# Patient Record
Sex: Male | Born: 1935 | Race: White | Hispanic: No | Marital: Married | Smoking: Former smoker
Health system: Southern US, Community
[De-identification: ages and names within clinical notes are randomized; demographics above are authoritative.]

## PROBLEM LIST (undated history)

## (undated) DIAGNOSIS — E119 Type 2 diabetes mellitus without complications: Secondary | ICD-10-CM

## (undated) DIAGNOSIS — I2510 Atherosclerotic heart disease of native coronary artery without angina pectoris: Secondary | ICD-10-CM

## (undated) DIAGNOSIS — I10 Essential (primary) hypertension: Secondary | ICD-10-CM

## (undated) DIAGNOSIS — N289 Disorder of kidney and ureter, unspecified: Secondary | ICD-10-CM

## (undated) HISTORY — PX: APPENDECTOMY: SHX54

## (undated) HISTORY — PX: BACK SURGERY: SHX140

---

## 2015-09-11 ENCOUNTER — Encounter (HOSPITAL_COMMUNITY): Payer: Self-pay | Admitting: Emergency Medicine

## 2015-09-11 ENCOUNTER — Emergency Department (HOSPITAL_COMMUNITY): Payer: Medicare Other

## 2015-09-11 ENCOUNTER — Emergency Department (HOSPITAL_COMMUNITY)
Admission: EM | Admit: 2015-09-11 | Discharge: 2015-09-11 | Disposition: A | Discharge status: Home / Self Care | Payer: Medicare Other | Attending: Emergency Medicine | Admitting: Emergency Medicine

## 2015-09-11 ENCOUNTER — Other Ambulatory Visit: Payer: Self-pay

## 2015-09-11 DIAGNOSIS — Z992 Dependence on renal dialysis: Secondary | ICD-10-CM | POA: Insufficient documentation

## 2015-09-11 DIAGNOSIS — I1311 Hypertensive heart and chronic kidney disease without heart failure, with stage 5 chronic kidney disease, or end stage renal disease: Secondary | ICD-10-CM | POA: Diagnosis not present

## 2015-09-11 DIAGNOSIS — E1122 Type 2 diabetes mellitus with diabetic chronic kidney disease: Secondary | ICD-10-CM | POA: Diagnosis not present

## 2015-09-11 DIAGNOSIS — R1013 Epigastric pain: Secondary | ICD-10-CM | POA: Diagnosis present

## 2015-09-11 DIAGNOSIS — N186 End stage renal disease: Secondary | ICD-10-CM | POA: Diagnosis not present

## 2015-09-11 DIAGNOSIS — Z87891 Personal history of nicotine dependence: Secondary | ICD-10-CM | POA: Insufficient documentation

## 2015-09-11 DIAGNOSIS — I2510 Atherosclerotic heart disease of native coronary artery without angina pectoris: Secondary | ICD-10-CM | POA: Insufficient documentation

## 2015-09-11 HISTORY — DX: Type 2 diabetes mellitus without complications: E11.9

## 2015-09-11 HISTORY — DX: Disorder of kidney and ureter, unspecified: N28.9

## 2015-09-11 HISTORY — DX: Atherosclerotic heart disease of native coronary artery without angina pectoris: I25.10

## 2015-09-11 HISTORY — DX: Essential (primary) hypertension: I10

## 2015-09-11 LAB — CBC
HEMATOCRIT: 34.9 % — AB (ref 39.0–52.0)
HEMOGLOBIN: 11 g/dL — AB (ref 13.0–17.0)
MCH: 31.1 pg (ref 26.0–34.0)
MCHC: 31.5 g/dL (ref 30.0–36.0)
MCV: 98.6 fL (ref 78.0–100.0)
Platelets: UNDETERMINED 10*3/uL (ref 150–400)
RBC: 3.54 MIL/uL — AB (ref 4.22–5.81)
RDW: 15.9 % — ABNORMAL HIGH (ref 11.5–15.5)
WBC: 7.7 10*3/uL (ref 4.0–10.5)

## 2015-09-11 LAB — BASIC METABOLIC PANEL
ANION GAP: 11 (ref 5–15)
BUN: 28 mg/dL — ABNORMAL HIGH (ref 6–20)
CHLORIDE: 100 mmol/L — AB (ref 101–111)
CO2: 24 mmol/L (ref 22–32)
Calcium: 9.2 mg/dL (ref 8.9–10.3)
Creatinine, Ser: 6.86 mg/dL — ABNORMAL HIGH (ref 0.61–1.24)
GFR calc non Af Amer: 7 mL/min — ABNORMAL LOW (ref >60)
GFR, EST AFRICAN AMERICAN: 8 mL/min — AB (ref >60)
GLUCOSE: 279 mg/dL — AB (ref 65–99)
Potassium: 5.4 mmol/L — ABNORMAL HIGH (ref 3.5–5.1)
Sodium: 135 mmol/L (ref 135–145)

## 2015-09-11 LAB — LIPASE, BLOOD: LIPASE: 43 U/L (ref 11–51)

## 2015-09-11 LAB — I-STAT TROPONIN, ED: TROPONIN I, POC: 0.14 ng/mL — AB (ref 0.00–0.08)

## 2015-09-11 MED ORDER — OXYCODONE-ACETAMINOPHEN 5-325 MG PO TABS
1.0000 | ORAL_TABLET | Freq: Once | ORAL | Status: AC
  Administered 2015-09-11: 1 via ORAL
  Filled 2015-09-11: qty 1

## 2015-09-11 MED ORDER — OXYCODONE-ACETAMINOPHEN 5-325 MG PO TABS: 1.0000 | ORAL_TABLET | Freq: Four times a day (QID) | ORAL | Status: DC | PRN

## 2015-09-11 NOTE — ED Notes (Signed)
Pt arrives via POv from home with chest pains and abdominal pains since last night. States recently admitted and discharged from [facility]. Pt is M/W/F dialysis pt.

## 2015-09-11 NOTE — Discharge Instructions (Signed)
°  Call [facility] to arrange a follow-up appointment. The contact information has been provided in this discharge summary for you to call and make these arrangements.  Return to the emergency department if your symptoms significantly worsen or change.   Abdominal Pain, Adult Many things can cause abdominal pain. Usually, abdominal pain is not caused by a disease and will improve without treatment. It can often be observed and treated at home. Your health care provider will do a physical exam and possibly order blood tests and X-rays to help determine the seriousness of your pain. However, in many cases, more time must pass before a clear cause of the pain can be found. Before that point, your health care provider may not know if you need more testing or further treatment. HOME CARE INSTRUCTIONS Monitor your abdominal pain for any changes. The following actions may help to alleviate any discomfort you are experiencing:  Only take over-the-counter or prescription medicines as directed by your health care provider.  Do not take laxatives unless directed to do so by your health care provider.  Try a clear liquid diet (broth, tea, or water) as directed by your health care provider. Slowly move to a bland diet as tolerated. SEEK MEDICAL CARE IF:  You have unexplained abdominal pain.  You have abdominal pain associated with nausea or diarrhea.  You have pain when you urinate or have a bowel movement.  You experience abdominal pain that wakes you in the night.  You have abdominal pain that is worsened or improved by eating food.  You have abdominal pain that is worsened with eating fatty foods.  You have a fever. SEEK IMMEDIATE MEDICAL CARE IF:  Your pain does not go away within 2 hours.  You keep throwing up (vomiting).  Your pain is felt only in portions of the abdomen, such as the right side or the left lower portion of the abdomen.  You pass bloody or black tarry  stools. MAKE SURE YOU:  Understand these instructions.  Will watch your condition.  Will get help right away if you are not doing well or get worse.   This information is not intended to replace advice given to you by your health care provider. Make sure you discuss any questions you have with your health care provider.   Document Released: 12/31/2004 Document Revised: 12/12/2014 Document Reviewed: 11/30/2012 Elsevier Interactive Patient Education [employer]2016 Elsevier Inc.

## 2015-09-11 NOTE — ED Provider Notes (Signed)
CSN: [id number]     Arrival date & time 09/11/15  1131 History   First MD Initiated Contact with Patient 09/11/15 1220     Chief Complaint  Patient presents with  . Chest Pain  . Abdominal Pain     (Consider location/radiation/quality/duration/timing/severity/associated sxs/prior Treatment) HPI Comments: Patient is a 80 year old male with past medical history of end-stage renal disease on hemodialysis, diabetes, and hypertension. He presents for evaluation of epigastric pain which is been worsening over the past several months. He reports the pain is constant, however does worsen at times. It radiates to the bilateral lower rib cage. He denies any fevers or chills. He denies any vomiting or diarrhea.  He was worked up at [facility] where he tells me he underwent a heart catheterization which revealed no significant stenoses, CT scan, and multiple other tests, however no cause was found.  Patient is a 80 y.o. male presenting with chest pain and abdominal pain. The history is provided by the patient.  Chest Pain Pain location:  Epigastric Pain quality: stabbing   Radiates to: Lower ribs on both sides. Pain radiates to the back: no   Pain severity:  Moderate Onset quality:  Gradual Duration:  3 months Timing:  Constant Progression:  Worsening Chronicity:  New Associated symptoms: abdominal pain   Abdominal Pain Associated symptoms: chest pain     Past Medical History  Diagnosis Date  . Coronary artery disease   . Renal disorder   . Hypertension   . Diabetes mellitus without complication [facility])    Past Surgical History  Procedure Laterality Date  . Appendectomy    . Back surgery     No family history on file. Social History  Substance Use Topics  . Smoking status: Former Games developer  . Smokeless tobacco: Not on file  . Alcohol Use: No    Review of Systems  Cardiovascular: Positive for chest pain.  Gastrointestinal: Positive for abdominal pain.  All other systems  reviewed and are negative.     Allergies  Lisinopril  Home Medications   Prior to Admission medications   Not on File   BP 165/53 mmHg  Pulse 66  Temp(Src) 98.1 F (36.7 C) (Oral)  Resp 18  Wt 186 lb 1.6 oz (84.414 kg)  SpO2 91% Physical Exam  Constitutional: He is oriented to person, place, and time. He appears well-developed and well-nourished. No distress.  HENT:  Head: Normocephalic and atraumatic.  Mouth/Throat: Oropharynx is clear and moist.  Neck: Normal range of motion. Neck supple.  Cardiovascular: Normal rate and regular rhythm.  Exam reveals no friction rub.   No murmur heard. Pulmonary/Chest: Effort normal and breath sounds normal. No respiratory distress. He has no wheezes. He has no rales.  Abdominal: Soft. Bowel sounds are normal. He exhibits no distension. There is no tenderness.  Musculoskeletal: Normal range of motion. He exhibits no edema.  Neurological: He is alert and oriented to person, place, and time. Coordination normal.  Skin: Skin is warm and dry. He is not diaphoretic.  Nursing note and vitals reviewed.   ED Course  Procedures (including critical care time) Labs Review Labs Reviewed  BASIC METABOLIC PANEL - Abnormal; Notable for the following:    Potassium 5.4 (*)    Chloride 100 (*)    Glucose, Bld 279 (*)    BUN 28 (*)    Creatinine, Ser 6.86 (*)    GFR calc non Af Amer 7 (*)    GFR calc Af Amer 8 (*)  All other components within normal limits  CBC - Abnormal; Notable for the following:    RBC 3.54 (*)    Hemoglobin 11.0 (*)    HCT 34.9 (*)    RDW 15.9 (*)    All other components within normal limits  I-STAT TROPOININ, ED - Abnormal; Notable for the following:    Troponin i, poc 0.14 (*)    All other components within normal limits  LIPASE, BLOOD    Imaging Review No results found. I have personally reviewed and evaluated these images and lab results as part of my medical decision-making.   EKG  Interpretation   Date/Time:  Wednesday September 11 2015 11:45:53 EDT Ventricular Rate:  57 PR Interval:  170 QRS Duration: 78 QT Interval:  468 QTC Calculation: 455 R Axis:   55 Text Interpretation:  Sinus bradycardia Anteroseptal infarct , age  undetermined ST \\T \ T wave abnormality, consider inferior ischemia  Abnormal ECG Confirmed by Oluwatosin Bracy  MD, Trevone Prestwood ([id number]) on 09/12/2015 9:14:57 AM      MDM   Final diagnoses:  None    Patient is a 80 year old male with past medical history of coronary artery disease. He presents with complaints of epigastric discomfort which has been going on for the past 2 months. Admitted at an outside facility, however no cause has been found. He presents here today hoping for an answer to why this discomfort is present. At the outside facility, he has undergone heart cath, ultrasound, CAT scan, laboratory studies. His CT scan was repeated today and was unremarkable. His laboratory studies do not reveal evidence for a cardiac etiology, pancreatitis, or other acute pathology. Patient will be given a referral to gastroenterology as an outpatient to discuss an endoscopy. He will also be provided with pain medication he can take for his discomfort. Reasons for return have been provided and the patient understands to return if he worsens.    Geoffery Lyonsouglas Oluwatimilehin Balfour, MD 09/12/15 [phone]

## 2015-12-11 ENCOUNTER — Emergency Department (HOSPITAL_COMMUNITY): Payer: Medicare Other

## 2015-12-11 ENCOUNTER — Encounter (HOSPITAL_COMMUNITY): Payer: Self-pay | Admitting: Emergency Medicine

## 2015-12-11 ENCOUNTER — Inpatient Hospital Stay (HOSPITAL_COMMUNITY)
Admission: EM | Admit: 2015-12-11 | Discharge: 2015-12-14 | DRG: 193 | Disposition: A | Discharge status: Home / Self Care | Payer: Medicare Other | Attending: Student in an Organized Health Care Education/Training Program | Admitting: Student in an Organized Health Care Education/Training Program

## 2015-12-11 DIAGNOSIS — Z794 Long term (current) use of insulin: Secondary | ICD-10-CM

## 2015-12-11 DIAGNOSIS — E8889 Other specified metabolic disorders: Secondary | ICD-10-CM | POA: Diagnosis present

## 2015-12-11 DIAGNOSIS — Z992 Dependence on renal dialysis: Secondary | ICD-10-CM | POA: Diagnosis not present

## 2015-12-11 DIAGNOSIS — J189 Pneumonia, unspecified organism: Principal | ICD-10-CM | POA: Diagnosis present

## 2015-12-11 DIAGNOSIS — I2510 Atherosclerotic heart disease of native coronary artery without angina pectoris: Secondary | ICD-10-CM | POA: Diagnosis present

## 2015-12-11 DIAGNOSIS — N186 End stage renal disease: Secondary | ICD-10-CM | POA: Diagnosis present

## 2015-12-11 DIAGNOSIS — E43 Unspecified severe protein-calorie malnutrition: Secondary | ICD-10-CM | POA: Diagnosis present

## 2015-12-11 DIAGNOSIS — N2581 Secondary hyperparathyroidism of renal origin: Secondary | ICD-10-CM | POA: Diagnosis present

## 2015-12-11 DIAGNOSIS — Z7902 Long term (current) use of antithrombotics/antiplatelets: Secondary | ICD-10-CM

## 2015-12-11 DIAGNOSIS — Z79899 Other long term (current) drug therapy: Secondary | ICD-10-CM

## 2015-12-11 DIAGNOSIS — E8770 Fluid overload, unspecified: Secondary | ICD-10-CM | POA: Diagnosis present

## 2015-12-11 DIAGNOSIS — D649 Anemia, unspecified: Secondary | ICD-10-CM | POA: Diagnosis present

## 2015-12-11 DIAGNOSIS — R05 Cough: Secondary | ICD-10-CM | POA: Diagnosis present

## 2015-12-11 DIAGNOSIS — Z7982 Long term (current) use of aspirin: Secondary | ICD-10-CM

## 2015-12-11 DIAGNOSIS — E1122 Type 2 diabetes mellitus with diabetic chronic kidney disease: Secondary | ICD-10-CM | POA: Diagnosis present

## 2015-12-11 DIAGNOSIS — R197 Diarrhea, unspecified: Secondary | ICD-10-CM | POA: Diagnosis present

## 2015-12-11 DIAGNOSIS — M7989 Other specified soft tissue disorders: Secondary | ICD-10-CM | POA: Diagnosis not present

## 2015-12-11 DIAGNOSIS — Z87891 Personal history of nicotine dependence: Secondary | ICD-10-CM | POA: Diagnosis not present

## 2015-12-11 DIAGNOSIS — I120 Hypertensive chronic kidney disease with stage 5 chronic kidney disease or end stage renal disease: Secondary | ICD-10-CM | POA: Diagnosis present

## 2015-12-11 LAB — CBC WITH DIFFERENTIAL/PLATELET
BASOS ABS: 0 10*3/uL (ref 0.0–0.1)
BASOS ABS: 0 10*3/uL (ref 0.0–0.1)
BASOS PCT: 0 %
Basophils Relative: 0 %
Eosinophils Absolute: 0 10*3/uL (ref 0.0–0.7)
Eosinophils Absolute: 0 10*3/uL (ref 0.0–0.7)
Eosinophils Relative: 0 %
Eosinophils Relative: 0 %
HEMATOCRIT: 11.6 % — AB (ref 39.0–52.0)
HEMATOCRIT: 27.5 % — AB (ref 39.0–52.0)
HEMOGLOBIN: 8.5 g/dL — AB (ref 13.0–17.0)
Hemoglobin: 3.6 g/dL — CL (ref 13.0–17.0)
LYMPHS ABS: 0.7 10*3/uL (ref 0.7–4.0)
LYMPHS PCT: 14 %
LYMPHS PCT: 12 %
Lymphs Abs: 1.3 10*3/uL (ref 0.7–4.0)
MCH: 31 pg (ref 26.0–34.0)
MCH: 31.1 pg (ref 26.0–34.0)
MCHC: 31 g/dL (ref 30.0–36.0)
MCHC: 30.9 g/dL (ref 30.0–36.0)
MCV: 100 fL (ref 78.0–100.0)
MCV: 100.7 fL — AB (ref 78.0–100.0)
MONOS PCT: 9 %
Monocytes Absolute: 0.4 10*3/uL (ref 0.1–1.0)
Monocytes Absolute: 0.9 10*3/uL (ref 0.1–1.0)
Monocytes Relative: 8 %
NEUTROS ABS: 3.8 10*3/uL (ref 1.7–7.7)
NEUTROS ABS: 8.2 10*3/uL — AB (ref 1.7–7.7)
NEUTROS PCT: 79 %
Neutrophils Relative %: 79 %
Platelets: 161 10*3/uL (ref 150–400)
RBC: 1.16 MIL/uL — AB (ref 4.22–5.81)
RBC: 2.73 MIL/uL — ABNORMAL LOW (ref 4.22–5.81)
RDW: 19.6 % — ABNORMAL HIGH (ref 11.5–15.5)
RDW: 19.1 % — ABNORMAL HIGH (ref 11.5–15.5)
WBC: 4.8 10*3/uL (ref 4.0–10.5)
WBC: 10.4 10*3/uL (ref 4.0–10.5)

## 2015-12-11 LAB — COMPREHENSIVE METABOLIC PANEL
ALBUMIN: 2.3 g/dL — AB (ref 3.5–5.0)
ALK PHOS: 59 U/L (ref 38–126)
ALT: 15 U/L — ABNORMAL LOW (ref 17–63)
ANION GAP: 13 (ref 5–15)
AST: 21 U/L (ref 15–41)
BUN: 60 mg/dL — ABNORMAL HIGH (ref 6–20)
CALCIUM: 7.5 mg/dL — AB (ref 8.9–10.3)
CO2: 21 mmol/L — AB (ref 22–32)
Chloride: 104 mmol/L (ref 101–111)
Creatinine, Ser: 6.73 mg/dL — ABNORMAL HIGH (ref 0.61–1.24)
GFR calc non Af Amer: 7 mL/min — ABNORMAL LOW (ref >60)
GFR, EST AFRICAN AMERICAN: 8 mL/min — AB (ref >60)
Glucose, Bld: 52 mg/dL — ABNORMAL LOW (ref 65–99)
POTASSIUM: 4.7 mmol/L (ref 3.5–5.1)
SODIUM: 138 mmol/L (ref 135–145)
Total Bilirubin: 0.4 mg/dL (ref 0.3–1.2)
Total Protein: 5.2 g/dL — ABNORMAL LOW (ref 6.5–8.1)

## 2015-12-11 LAB — PROTIME-INR
INR: 1.22
Prothrombin Time: 15.5 seconds — ABNORMAL HIGH (ref 11.4–15.2)

## 2015-12-11 LAB — TYPE AND SCREEN
ABO/RH(D): O POS
ANTIBODY SCREEN: NEGATIVE

## 2015-12-11 LAB — ABO/RH: ABO/RH(D): O POS

## 2015-12-11 LAB — I-STAT TROPONIN, ED: Troponin i, poc: 0.17 ng/mL (ref 0.00–0.08)

## 2015-12-11 LAB — I-STAT CG4 LACTIC ACID, ED
LACTIC ACID, VENOUS: 0.33 mmol/L — AB (ref 0.5–1.9)
Lactic Acid, Venous: 0.5 mmol/L (ref 0.5–1.9)
Lactic Acid, Venous: 1.07 mmol/L (ref 0.5–1.9)

## 2015-12-11 LAB — GLUCOSE, CAPILLARY: Glucose-Capillary: 224 mg/dL — ABNORMAL HIGH (ref 65–99)

## 2015-12-11 MED ORDER — ASPIRIN EC 81 MG PO TBEC
81.0000 mg | DELAYED_RELEASE_TABLET | Freq: Every day | ORAL | Status: DC
  Administered 2015-12-11 – 2015-12-14 (×4): 81 mg via ORAL
  Filled 2015-12-11 (×4): qty 1

## 2015-12-11 MED ORDER — VANCOMYCIN HCL 10 G IV SOLR
2000.0000 mg | Freq: Once | INTRAVENOUS | Status: AC
  Administered 2015-12-11: 2000 mg via INTRAVENOUS
  Filled 2015-12-11: qty 2000

## 2015-12-11 MED ORDER — DEXTROSE 5 % IV SOLN
2.0000 g | INTRAVENOUS | Status: AC
  Administered 2015-12-11 – 2015-12-13 (×2): 2 g via INTRAVENOUS
  Filled 2015-12-11 (×2): qty 2

## 2015-12-11 MED ORDER — SEVELAMER CARBONATE 800 MG PO TABS
800.0000 mg | ORAL_TABLET | Freq: Every day | ORAL | Status: DC
  Administered 2015-12-12 – 2015-12-13 (×2): 800 mg via ORAL
  Filled 2015-12-11 (×3): qty 1

## 2015-12-11 MED ORDER — INSULIN ASPART 100 UNIT/ML ~~LOC~~ SOLN
0.0000 [IU] | Freq: Every day | SUBCUTANEOUS | Status: DC
  Administered 2015-12-13: 2 [IU] via SUBCUTANEOUS

## 2015-12-11 MED ORDER — SIMVASTATIN 20 MG PO TABS
20.0000 mg | ORAL_TABLET | Freq: Every day | ORAL | Status: DC
  Administered 2015-12-11 – 2015-12-13 (×3): 20 mg via ORAL
  Filled 2015-12-11 (×3): qty 1

## 2015-12-11 MED ORDER — RENA-VITE PO TABS
1.0000 | ORAL_TABLET | Freq: Every day | ORAL | Status: DC
Start: 2015-12-11 — End: 2015-12-14
  Administered 2015-12-11 – 2015-12-13 (×3): 1 via ORAL
  Filled 2015-12-11 (×3): qty 1

## 2015-12-11 MED ORDER — PIPERACILLIN-TAZOBACTAM 3.375 G IVPB
3.3750 g | Freq: Two times a day (BID) | INTRAVENOUS | Status: DC
  Filled 2015-12-11: qty 50

## 2015-12-11 MED ORDER — ACETAMINOPHEN 325 MG PO TABS
650.0000 mg | ORAL_TABLET | Freq: Four times a day (QID) | ORAL | Status: DC | PRN
  Administered 2015-12-11: 650 mg via ORAL
  Filled 2015-12-11: qty 2

## 2015-12-11 MED ORDER — INSULIN ASPART 100 UNIT/ML ~~LOC~~ SOLN
0.0000 [IU] | Freq: Three times a day (TID) | SUBCUTANEOUS | Status: DC
  Administered 2015-12-12: 2 [IU] via SUBCUTANEOUS
  Administered 2015-12-12: 3 [IU] via SUBCUTANEOUS
  Administered 2015-12-13: 5 [IU] via SUBCUTANEOUS
  Administered 2015-12-13: 9 [IU] via SUBCUTANEOUS
  Administered 2015-12-13: 7 [IU] via SUBCUTANEOUS

## 2015-12-11 MED ORDER — CLOPIDOGREL BISULFATE 75 MG PO TABS
75.0000 mg | ORAL_TABLET | Freq: Every day | ORAL | Status: DC
  Administered 2015-12-11 – 2015-12-14 (×4): 75 mg via ORAL
  Filled 2015-12-11 (×4): qty 1

## 2015-12-11 MED ORDER — GABAPENTIN 100 MG PO CAPS: 100.0000 mg | ORAL_CAPSULE | ORAL | Status: DC

## 2015-12-11 MED ORDER — CARVEDILOL 25 MG PO TABS
25.0000 mg | ORAL_TABLET | Freq: Two times a day (BID) | ORAL | Status: DC
  Administered 2015-12-12 – 2015-12-13 (×3): 25 mg via ORAL
  Filled 2015-12-11 (×3): qty 1

## 2015-12-11 MED ORDER — SEVELAMER CARBONATE 800 MG PO TABS
1600.0000 mg | ORAL_TABLET | Freq: Two times a day (BID) | ORAL | Status: DC
  Administered 2015-12-12 – 2015-12-14 (×5): 1600 mg via ORAL
  Filled 2015-12-11 (×4): qty 2

## 2015-12-11 MED ORDER — CINACALCET HCL 30 MG PO TABS
30.0000 mg | ORAL_TABLET | Freq: Every day | ORAL | Status: DC
  Administered 2015-12-11 – 2015-12-13 (×3): 30 mg via ORAL
  Filled 2015-12-11 (×4): qty 1

## 2015-12-11 MED ORDER — VANCOMYCIN HCL IN DEXTROSE 1-5 GM/200ML-% IV SOLN: 1000.0000 mg | INTRAVENOUS | Status: DC

## 2015-12-11 MED ORDER — HEPARIN SODIUM (PORCINE) 5000 UNIT/ML IJ SOLN
5000.0000 [IU] | Freq: Three times a day (TID) | INTRAMUSCULAR | Status: DC
  Administered 2015-12-11 – 2015-12-14 (×8): 5000 [IU] via SUBCUTANEOUS
  Filled 2015-12-11 (×8): qty 1

## 2015-12-11 MED ORDER — ACETAMINOPHEN 325 MG PO TABS
650.0000 mg | ORAL_TABLET | Freq: Once | ORAL | Status: AC
  Administered 2015-12-11: 650 mg via ORAL
  Filled 2015-12-11: qty 2

## 2015-12-11 MED ORDER — PIPERACILLIN-TAZOBACTAM 3.375 G IVPB 30 MIN
3.3750 g | Freq: Once | INTRAVENOUS | Status: AC
  Administered 2015-12-11: 3.375 g via INTRAVENOUS
  Filled 2015-12-11: qty 50

## 2015-12-11 MED ORDER — PANTOPRAZOLE SODIUM 40 MG PO TBEC
40.0000 mg | DELAYED_RELEASE_TABLET | Freq: Every day | ORAL | Status: DC
  Administered 2015-12-11 – 2015-12-14 (×4): 40 mg via ORAL
  Filled 2015-12-11 (×4): qty 1

## 2015-12-11 NOTE — Progress Notes (Signed)
Pt arrived via 5w17 by strecher. Pt moved over to bed by nursing staff. Pt A&O x4. ROM x4. Bilateral le edema. Blister on right buttock open no drainage. Pt states "[facility]. V/S taken see chart. Fistula right forearm positive for thrill and bruie. Graft in left forearm not working. Will continue to monitor. Francee Piccoloerek RCharity fundraiser

## 2015-12-11 NOTE — Progress Notes (Signed)
Pharmacy Antibiotic Note  Allen NorrisWilliam Raz is a 80 y.o. male admitted on 12/11/2015 with pneumonia.  Pharmacy has been consulted for vancomycin and zosyn dosing. Tmax is 102.7 and WBC is WNL. Pt is on HD with last session on Monday. He is s/p [facility].   Plan: - Vancomycin 2gm IV x 1 then 1gm post-HD  - Zosyn 3.375gm IV Q12H (4 hr inf) - F/u renal plans, C&S, clinical status and pre-HD level when appropriate     Temp (24hrs), Avg:102.3 F (39.1 C), Min:101.8 F (38.8 C), Max:102.7 F (39.3 C)   Recent Labs Lab 12/11/15 1345 12/11/15 1359 12/11/15 1431 12/11/15 1500 12/11/15 1508  WBC 4.8  --   --  10.4  --   CREATININE  --   --   --  6.73*  --   LATICACIDVEN  --  0.33* 0.50  --  1.07    CrCl cannot be calculated (Unknown ideal weight.).    Allergies  Allergen Reactions  . Lisinopril Cough    Antimicrobials this admission: Pending  Dose adjustments this admission: N/A  Microbiology results: Pending  Thank you for allowing pharmacy to be a part of this patient's care.  Tyrees Chopin, Drake LeachRachel Lynn 12/11/2015 3:48 PM

## 2015-12-11 NOTE — ED Notes (Signed)
Pt given meal

## 2015-12-11 NOTE — H&P (Signed)
Date: 12/11/2015               Patient Name:  Chad Murray MRN: [id number]  DOB: [birth year] Age / Sex: 80 y.o., male   PCP: Lindwood Qua, MD         Medical Service: Internal Medicine Teaching Service         Attending Physician: Dr. Abelino Derrick, MD    First Contact: Dr. Peggyann Juba Pager: [phone]  Second Contact: Dr. Lawerance Bach Pager: [phone]       After Hours (After 5p/  First Contact Pager: [phone]  weekends / holidays): Second Contact Pager: [phone]   Chief Complaint: Fevers and cough  History of Present Illness: Chad Murray is a 80 year old man with a recent extended hospitalization at [facility] for sepsis who presents with cough, fevers, chills, and weakness for the past 3 days.  His history is also significant for ESRD, DM, and HTN.  At dialysis on Monday, he began to feel poorly with palpitations, fevers and chills.  Since then, his symptoms worsened with generalized weakness, non-productive cough, and fevers/chills.  He has had chronic loose stools for years, but has had no recent change in bowel habits.  He presented to the ED today, and was started on Vancomycin and Piperacilline-tazobactam for pneumonia.  He was hospitalized at [facility] from 7/28 - 11/21/2015 for septic shock complicated by hypercarbic respiratory failure and intubation.  He presented with fever, malaise, productive cough, and AMS and was found to be septic with diffuse lymphadenopathy.  Concern for lymphoma vs EBV, lymph node biopsy was nondiagnostic, and LA improved.  No source of infection was found, but he was treated with vancomycin and cefepime.  His condition improved and he was discharged home on a prednisone taper.  Meds:  Current Meds  Medication Sig  . amLODipine (NORVASC) 10 MG tablet Take 10 mg by mouth daily.  Marland Kitchen aspirin EC 81 MG tablet Take 81 mg by mouth daily.   Marland Kitchen b complex-vitamin c-folic acid (NEPHRO-VITE) 0.8 MG TABS tablet Take 1 tablet by mouth daily.  . carvedilol (COREG) 25 MG  tablet Take 25 mg by mouth 2 (two) times daily with a meal.   . cinacalcet (SENSIPAR) 30 MG tablet Take 30 mg by mouth daily with supper.  . clopidogrel (PLAVIX) 75 MG tablet Take 75 mg by mouth daily.  Marland Kitchen gabapentin (NEURONTIN) 100 MG capsule Take 100 mg by mouth every Monday, Wednesday, and Friday with hemodialysis. Takes at dialysis only  . insulin aspart (NOVOLOG) 100 UNIT/ML injection Inject 4-7 Units into the skin 3 (three) times daily with meals. Patient uses sliding scale  . insulin glargine (LANTUS) 100 UNIT/ML injection Inject 9-12 Units into the skin at bedtime.   Marland Kitchen omeprazole (PRILOSEC) 20 MG capsule Take 20 mg by mouth every evening.   . sevelamer carbonate (RENVELA) 800 MG tablet Take 800-1,600 mg by mouth 3 (three) times daily with meals. Takes 2 tabs in am, 1 tab midday, 2 tabs in evening  . simvastatin (ZOCOR) 40 MG tablet Take 20 mg by mouth daily at 6 PM.     Allergies: Allergies as of 12/11/2015 - Review Complete 12/11/2015  Allergen Reaction Noted  . Enalapril Cough 09/30/2013  . Lisinopril Cough 11/26/2011   Past Medical History:  Diagnosis Date  . Coronary artery disease   . Diabetes mellitus without complication (HCC)   . Hypertension   . Renal disorder     Family History: No CAD or DM.  Social History: Former smoker (15 pack year history).  No alcohol/drugs.  Review of Systems: A complete ROS was negative except as per HPI.  Physical Exam: Blood pressure (!) 141/44, pulse 81, temperature 101.8 F (38.8 C), temperature source Rectal, resp. rate 20, SpO2 97 %.  Physical Exam  Constitutional: He is oriented to person, place, and time.  Tired, ill appearing man lying in stretcher in no acute distress  HENT:  Dry MM  Eyes: Conjunctivae and EOM are normal. Pupils are equal, round, and reactive to light. No scleral icterus.  Neck: Normal range of motion. Neck supple.  Cardiovascular: Normal rate and regular rhythm.   Pulmonary/Chest:  No respiratory  distress Bibasilar crackles Rare wheeze  Abdominal: Soft. He exhibits no distension. There is no tenderness.  Genitourinary:  Genitourinary Comments: No inguinal lymphadenopathy  Musculoskeletal:  No axillary lymphadenopathy Gross pitting edema of bilateral LEs  Lymphadenopathy:    He has no cervical adenopathy.  Neurological: He is alert and oriented to person, place, and time. He displays normal reflexes. No cranial nerve deficit. He exhibits normal muscle tone.  Skin: Skin is warm and dry.  Psychiatric: He has a normal mood and affect. His behavior is normal.     CBC Latest Ref Rng & Units 12/11/2015 12/11/2015 09/11/2015  WBC 4.0 - 10.5 K/uL 10.4 4.8 7.7  Hemoglobin 13.0 - 17.0 g/dL 1.6(X8.5(L) 3.6(LL) 11.0(L)  Hematocrit 39.0 - 52.0 % 27.5(L) 11.6(L) 34.9(L)  Platelets 150 - 400 K/uL 161 QUESTIONABLE RESULTS, RECOMMEND RECOLLECT TO VERIFY PLATELET CLUMPS NOTED ON SMEAR, UNABLE TO ESTIMATE   BMP Latest Ref Rng & Units 12/11/2015 09/11/2015  Glucose 65 - 99 mg/dL 09(U52(L) 045(W279(H)  BUN 6 - 20 mg/dL 09(W60(H) 11(B28(H)  Creatinine 0.61 - 1.24 mg/dL 1.47(W6.73(H) 2.95(A6.86(H)  Sodium 135 - 145 mmol/L 138 135  Potassium 3.5 - 5.1 mmol/L 4.7 5.4(H)  Chloride 101 - 111 mmol/L 104 100(L)  CO2 22 - 32 mmol/L 21(L) 24  Calcium 8.9 - 10.3 mg/dL 7.5(L) 9.2   Troponin (Point of Care Test)  Recent Labs  12/11/15 1506  TROPIPOC 0.17*    EKG: NSR, no acute ischemic changes  CXR: "Patchy alveolar opacities likely reflect bilateral pneumonia given the patient's symptoms. There may be a component of low-grade CHF."  Assessment & Plan by Problem: Active Problems:   HCAP (healthcare-associated pneumonia)  #Pneumonia Fever, CXR, and cough suggestive pneumonia.  Hemodynamically stable.  Recent extended hospitalization and intubation increases concern or MRSA and pseudomonas.  Also possible is a malignancy given prior concern for lymphoma and his constitutional symptoms.  However, with his pulmonary findings, infection is  a more likely cause.  Clinically volume overloaded, not hypotensive, holding currently holding fluids. -Vancomycin and cefepime -F/u blood cultures  #DM -SSI  #ESRD Consulted renal.  Will dialyze tomorrow. -Continue home calcimimetic and phosphate binder  #HTN -Continue home meds  Dispo: Admit patient to Inpatient with expected length of stay greater than 2 midnights.  Signed: Alm BustardMatthew O'Sullivan, MD 12/11/2015, 4:42 PM  Pager: [phone]

## 2015-12-11 NOTE — ED Provider Notes (Signed)
[facility]-EMERGENCY DEPT Provider Note   CSN: [id number] Arrival date & time: 12/11/15  1319     History   Chief Complaint Chief Complaint  Patient presents with  . Fever  . Cough    HPI Chad Murray is a 80 y.o. male.  HPI   Patient is a 80 year old male presenting with fever. Patient had 17 day long stay and the [facility] hospitals. Patient is unable to tell me what happened during that stay. He reports a "couldn't figure it out". Patient went home 6 days ago. He's been spiking temperatures and reports back here today. He is here for "second opinion". Patient has no focal symptoms except mild mild cough. No urinary symptoms or otherwise.  Patient's medical history is significant for dialysis, CAD, diabetes, hypertension.  Past Medical History:  Diagnosis Date  . Coronary artery disease   . Diabetes mellitus without complication (HCC)   . Hypertension   . Renal disorder     There are no active problems to display for this patient.   Past Surgical History:  Procedure Laterality Date  . APPENDECTOMY    . BACK SURGERY         Home Medications    Prior to Admission medications   Medication Sig Start Date End Date Taking? Authorizing Provider  amitriptyline (ELAVIL) 10 MG tablet Take 10 mg by mouth at bedtime. 09/05/15   Historical Provider, MD  aspirin EC 81 MG tablet Take 81 mg by mouth.    Historical Provider, MD  atorvastatin (LIPITOR) 40 MG tablet Take 40 mg by mouth daily. 09/05/15   Historical Provider, MD  b complex-vitamin c-folic acid (NEPHRO-VITE) 0.8 MG TABS tablet Take 1 tablet by mouth daily.    Historical Provider, MD  carvedilol (COREG) 25 MG tablet Take 25 mg by mouth.    Historical Provider, MD  cinacalcet (SENSIPAR) 30 MG tablet Take 30 mg by mouth.    Historical Provider, MD  clopidogrel (PLAVIX) 75 MG tablet Take 75 mg by mouth daily. 09/05/15   Historical Provider, MD  gabapentin (NEURONTIN) 100 MG capsule Take 100 mg by mouth daily. 09/05/15   Historical  Provider, MD  insulin aspart (NOVOLOG) 100 UNIT/ML injection Inject into the skin. Patient uses sliding scale    Historical Provider, MD  insulin glargine (LANTUS) 100 UNIT/ML injection Inject 15 Units into the skin at bedtime.    Historical Provider, MD  losartan (COZAAR) 100 MG tablet Take 100 mg by mouth.    Historical Provider, MD  metoCLOPramide (REGLAN) 10 MG tablet Take 10 mg by mouth 4 (four) times daily.    Historical Provider, MD  omeprazole (PRILOSEC) 20 MG capsule Take 20 mg by mouth daily.    Historical Provider, MD  oxyCODONE-acetaminophen (PERCOCET) 5-325 MG tablet Take 1-2 tablets by mouth every 6 (six) hours as needed. 09/11/15   Geoffery Lyons, MD  sevelamer carbonate (RENVELA) 800 MG tablet Take 1,600 mg by mouth 3 (three) times daily as needed.    Historical Provider, MD    Family History History reviewed. No pertinent family history.  Social History Social History  Substance Use Topics  . Smoking status: Former Games developer  . Smokeless tobacco: Not on file  . Alcohol use No     Allergies   Lisinopril   Review of Systems Review of Systems  Constitutional: Positive for chills and fever.  Respiratory: Positive for cough and chest tightness.   Cardiovascular: Negative for chest pain.  Neurological: Positive for weakness.  Hematological: Positive for adenopathy.  Psychiatric/Behavioral: Negative for confusion.  All other systems reviewed and are negative.    Physical Exam Updated Vital Signs BP (!) 141/44   Pulse 81   Temp 101.8 F (38.8 C) (Rectal)   Resp 20   SpO2 97%   Physical Exam  Constitutional: He is oriented to person, place, and time. He appears well-nourished.  cachetic  HENT:  Head: Normocephalic.  Eyes: Conjunctivae are normal.  Neck: Normal range of motion.  Cardiovascular: Normal rate.   Pulmonary/Chest: Effort normal. No respiratory distress. He has no wheezes.  Abdominal: Soft. There is no tenderness.  Musculoskeletal: Normal range of  motion.  Neurological: He is oriented to person, place, and time.  Skin: Skin is warm and dry. He is not diaphoretic.  Psychiatric: He has a normal mood and affect. His behavior is normal.     ED Treatments / Results  Labs (all labs ordered are listed, but only abnormal results are displayed) Labs Reviewed  CBC WITH DIFFERENTIAL/PLATELET - Abnormal; Notable for the following:       Result Value   RBC 1.16 (*)    Hemoglobin 3.6 (*)    HCT 11.6 (*)    RDW 19.6 (*)    All other components within normal limits  PROTIME-INR - Abnormal; Notable for the following:    Prothrombin Time 15.5 (*)    All other components within normal limits  COMPREHENSIVE METABOLIC PANEL - Abnormal; Notable for the following:    CO2 21 (*)    Glucose, Bld 52 (*)    BUN 60 (*)    Creatinine, Ser 6.73 (*)    Calcium 7.5 (*)    Total Protein 5.2 (*)    Albumin 2.3 (*)    ALT 15 (*)    GFR calc non Af Amer 7 (*)    GFR calc Af Amer 8 (*)    All other components within normal limits  CBC WITH DIFFERENTIAL/PLATELET - Abnormal; Notable for the following:    RBC 2.73 (*)    Hemoglobin 8.5 (*)    HCT 27.5 (*)    MCV 100.7 (*)    RDW 19.1 (*)    Neutro Abs 8.2 (*)    All other components within normal limits  I-STAT CG4 LACTIC ACID, ED - Abnormal; Notable for the following:    Lactic Acid, Venous 0.33 (*)    All other components within normal limits  I-STAT TROPOININ, ED - Abnormal; Notable for the following:    Troponin i, poc 0.17 (*)    All other components within normal limits  CULTURE, BLOOD (ROUTINE X 2)  CULTURE, BLOOD (ROUTINE X 2)  URINE CULTURE  URINALYSIS, ROUTINE W REFLEX MICROSCOPIC (NOT AT [facility])  I-STAT CG4 LACTIC ACID, ED  I-STAT CG4 LACTIC ACID, ED  I-STAT CG4 LACTIC ACID, ED  TYPE AND SCREEN    EKG  EKG Interpretation None       Radiology Dg Chest 2 View  Result Date: 12/11/2015 CLINICAL DATA:  Two days of cough and fever. History of diabetes, hypertension, unspecified  renal disorder, former smoker. EXAM: CHEST  2 VIEW COMPARISON:  Portable chest x-ray of November 01, 2015. FINDINGS: There new confluent interstitial and alveolar opacities bilaterally. The heart is normal in size. The pulmonary vascularity is not clearly engorged. There is no pleural effusion. There is calcification in the wall of the aortic arch. The bony thorax exhibits no acute abnormality. IMPRESSION: Patchy alveolar opacities likely reflect bilateral pneumonia given the patient's symptoms. There may  be a component of low-grade CHF. Electronically Signed   By: David  SwazilandJordan M.D.   On: 12/11/2015 14:43    Procedures Procedures (including critical care time)  Medications Ordered in ED Medications  piperacillin-tazobactam (ZOSYN) IVPB 3.375 g (not administered)  vancomycin (VANCOCIN) 2,000 mg in sodium chloride 0.9 % 500 mL IVPB (not administered)  vancomycin (VANCOCIN) IVPB 1000 mg/200 mL premix (not administered)  piperacillin-tazobactam (ZOSYN) IVPB 3.375 g (not administered)  acetaminophen (TYLENOL) tablet 650 mg (650 mg Oral Given 12/11/15 1600)     Initial Impression / Assessment and Plan / ED Course  I have reviewed the triage vital signs and the nursing notes.  Pertinent labs & imaging results that were available during my care of the patient were reviewed by me and considered in my medical decision making (see chart for details).  Clinical Course   Patient is a 80 year old male with on dialysis with hypertension hyperlipidemia CAD. He is presenting after [facility]. Patient is unable to give me any testing to history about the results there. He said "they couldn't figure it out". In reviewing briefly the records it appears the patient had a prolonged hospital stay including intubation, multiple CAT scans, lumbar puncture, and extensive labs a. I cannot find an narrative explanation of what happened. The CT seem to show adenopathy diffusely- questionable for  lymphoma. LP completed already  Patient today has fever, no symptoms accept long term cough. We will get blood cultures, chest x-ray urine. Will likely need to admit.  Chest xray shows pna.  Can't [facility] to [facility]. Could be that patient has new HCAP from recent hospitlization. Will treat as such.  [facility] He refers prefers to be seen here.    Final Clinical Impressions(s) / ED Diagnoses   Final diagnoses:  None    New Prescriptions New Prescriptions   No medications on file     Berkeley Veldman Randall AnLyn Suriya Kovarik, MD 12/11/15 [phone]

## 2015-12-11 NOTE — ED Notes (Signed)
Patient transported to X-ray 

## 2015-12-11 NOTE — ED Triage Notes (Signed)
Pt arrives via EMS from home with 1 week hx of prod cough, fever to 103 at home and weakness. Pt alert, oriented x4, VSS during transport. 975mg  tylenol PTA. 20g LFA. duoneb PTA. Pt on MWF dialysis.

## 2015-12-12 ENCOUNTER — Inpatient Hospital Stay (HOSPITAL_COMMUNITY): Payer: Medicare Other

## 2015-12-12 DIAGNOSIS — Z992 Dependence on renal dialysis: Secondary | ICD-10-CM

## 2015-12-12 DIAGNOSIS — E1122 Type 2 diabetes mellitus with diabetic chronic kidney disease: Secondary | ICD-10-CM

## 2015-12-12 DIAGNOSIS — Z794 Long term (current) use of insulin: Secondary | ICD-10-CM

## 2015-12-12 DIAGNOSIS — N186 End stage renal disease: Secondary | ICD-10-CM

## 2015-12-12 DIAGNOSIS — J189 Pneumonia, unspecified organism: Principal | ICD-10-CM

## 2015-12-12 DIAGNOSIS — M7989 Other specified soft tissue disorders: Secondary | ICD-10-CM

## 2015-12-12 DIAGNOSIS — I120 Hypertensive chronic kidney disease with stage 5 chronic kidney disease or end stage renal disease: Secondary | ICD-10-CM

## 2015-12-12 LAB — GLUCOSE, CAPILLARY
GLUCOSE-CAPILLARY: 198 mg/dL — AB (ref 65–99)
GLUCOSE-CAPILLARY: 217 mg/dL — AB (ref 65–99)
GLUCOSE-CAPILLARY: 114 mg/dL — AB (ref 65–99)
Glucose-Capillary: 184 mg/dL — ABNORMAL HIGH (ref 65–99)

## 2015-12-12 LAB — CBC
HCT: 25.4 % — ABNORMAL LOW (ref 39.0–52.0)
Hemoglobin: 8.2 g/dL — ABNORMAL LOW (ref 13.0–17.0)
MCH: 31.4 pg (ref 26.0–34.0)
MCHC: 32.3 g/dL (ref 30.0–36.0)
MCV: 97.3 fL (ref 78.0–100.0)
PLATELETS: 166 10*3/uL (ref 150–400)
RBC: 2.61 MIL/uL — AB (ref 4.22–5.81)
RDW: 19.1 % — AB (ref 11.5–15.5)
WBC: 8 10*3/uL (ref 4.0–10.5)

## 2015-12-12 LAB — BASIC METABOLIC PANEL
Anion gap: 13 (ref 5–15)
BUN: 64 mg/dL — AB (ref 6–20)
CALCIUM: 7.5 mg/dL — AB (ref 8.9–10.3)
CO2: 22 mmol/L (ref 22–32)
CREATININE: 7.2 mg/dL — AB (ref 0.61–1.24)
Chloride: 102 mmol/L (ref 101–111)
GFR calc non Af Amer: 6 mL/min — ABNORMAL LOW (ref >60)
GFR, EST AFRICAN AMERICAN: 8 mL/min — AB (ref >60)
Glucose, Bld: 181 mg/dL — ABNORMAL HIGH (ref 65–99)
Potassium: 4.7 mmol/L (ref 3.5–5.1)
SODIUM: 137 mmol/L (ref 135–145)

## 2015-12-12 LAB — RENAL FUNCTION PANEL
ALBUMIN: 1.9 g/dL — AB (ref 3.5–5.0)
Anion gap: 13 (ref 5–15)
BUN: 69 mg/dL — AB (ref 6–20)
CALCIUM: 7.6 mg/dL — AB (ref 8.9–10.3)
CO2: 20 mmol/L — ABNORMAL LOW (ref 22–32)
CREATININE: 7.47 mg/dL — AB (ref 0.61–1.24)
Chloride: 103 mmol/L (ref 101–111)
GFR, EST AFRICAN AMERICAN: 7 mL/min — AB (ref >60)
GFR, EST NON AFRICAN AMERICAN: 6 mL/min — AB (ref >60)
Glucose, Bld: 168 mg/dL — ABNORMAL HIGH (ref 65–99)
Phosphorus: 5.4 mg/dL — ABNORMAL HIGH (ref 2.5–4.6)
Potassium: 4.5 mmol/L (ref 3.5–5.1)
SODIUM: 136 mmol/L (ref 135–145)

## 2015-12-12 LAB — C DIFFICILE QUICK SCREEN W PCR REFLEX
C DIFFICILE (CDIFF) INTERP: NOT DETECTED
C Diff antigen: NEGATIVE
C Diff toxin: NEGATIVE

## 2015-12-12 LAB — LACTATE DEHYDROGENASE: LDH: 211 U/L — ABNORMAL HIGH (ref 98–192)

## 2015-12-12 LAB — URIC ACID: Uric Acid, Serum: 7.2 mg/dL (ref 4.4–7.6)

## 2015-12-12 LAB — INFLUENZA PANEL BY PCR (TYPE A & B)
H1N1 flu by pcr: NOT DETECTED
INFLAPCR: NEGATIVE
INFLBPCR: NEGATIVE

## 2015-12-12 MED ORDER — SODIUM CHLORIDE 0.9 % IV SOLN: 100.0000 mL | INTRAVENOUS | Status: DC | PRN

## 2015-12-12 MED ORDER — ALTEPLASE 2 MG IJ SOLR: 2.0000 mg | Freq: Once | INTRAMUSCULAR | Status: DC | PRN

## 2015-12-12 MED ORDER — PENTAFLUOROPROP-TETRAFLUOROETH EX AERO: 1.0000 "application " | INHALATION_SPRAY | CUTANEOUS | Status: DC | PRN

## 2015-12-12 MED ORDER — LIDOCAINE-PRILOCAINE 2.5-2.5 % EX CREA: 1.0000 "application " | TOPICAL_CREAM | CUTANEOUS | Status: DC | PRN

## 2015-12-12 MED ORDER — LOPERAMIDE HCL 2 MG PO CAPS
4.0000 mg | ORAL_CAPSULE | Freq: Once | ORAL | Status: AC
  Administered 2015-12-12: 4 mg via ORAL
  Filled 2015-12-12: qty 2

## 2015-12-12 MED ORDER — HEPARIN SODIUM (PORCINE) 1000 UNIT/ML DIALYSIS: 20.0000 [IU]/kg | INTRAMUSCULAR | Status: DC | PRN

## 2015-12-12 MED ORDER — LIDOCAINE HCL (PF) 1 % IJ SOLN: 5.0000 mL | INTRAMUSCULAR | Status: DC | PRN

## 2015-12-12 MED ORDER — HEPARIN SODIUM (PORCINE) 1000 UNIT/ML DIALYSIS: 1000.0000 [IU] | INTRAMUSCULAR | Status: DC | PRN

## 2015-12-12 NOTE — Procedures (Signed)
Pt seen on HD.  Af 140 Vp 120  BFR 400.  SBP 106.  Tolerating HD well so.  I would hold carvedilol for SBP  < 110.  Change to renal diet.

## 2015-12-12 NOTE — Progress Notes (Signed)
Patient arrived to unit per bed.  Reviewed treatment plan and this RN agrees.  Report received from bedside RN, Leitha BleakKatalina.  Consent obtained.  Patient A & O X 4. Lung sounds diminished to ausculation in all fields. BLE edema. Cardiac: A fib.  Prepped RLAVF with alcohol and cannulated with two 15 gauge needles.  Pulsation of blood noted.  Flushed access well with saline per protocol.  Connected and secured lines and initiated tx at 1454.  UF goal of 3500 mL and net fluid removal of 3000 mL.  Will continue to monitor.

## 2015-12-12 NOTE — Discharge Summary (Signed)
Name: Chad NorrisWilliam Murray MRN: [id number] DOB: 1935/12/11 80 y.o. PCP: Lindwood QuaByron Hoffman, MD  Date of Admission: 12/11/2015  1:19 PM Date of Discharge: 12/15/2015 Attending Physician: Erlinda Honguncan Vincent, MD  Discharge Diagnosis:  1. Community acquired pneumonia   Discharge Medications:   Medication List    STOP taking these medications   oxyCODONE-acetaminophen 5-325 MG tablet Commonly known as:  PERCOCET     TAKE these medications   amLODipine 10 MG tablet Commonly known as:  NORVASC Take 10 mg by mouth daily.   aspirin EC 81 MG tablet Take 81 mg by mouth daily.   b complex-vitamin c-folic acid 0.8 MG Tabs tablet Take 1 tablet by mouth daily.   carvedilol 25 MG tablet Commonly known as:  COREG Take 25 mg by mouth 2 (two) times daily with a meal.   cinacalcet 30 MG tablet Commonly known as:  SENSIPAR Take 30 mg by mouth daily with supper.   clopidogrel 75 MG tablet Commonly known as:  PLAVIX Take 75 mg by mouth daily.   gabapentin 100 MG capsule Commonly known as:  NEURONTIN Take 100 mg by mouth every Monday, Wednesday, and Friday with hemodialysis. Takes at dialysis only   insulin aspart 100 UNIT/ML injection Commonly known as:  novoLOG Inject 4-7 Units into the skin 3 (three) times daily with meals. Patient uses sliding scale   insulin glargine 100 UNIT/ML injection Commonly known as:  LANTUS Inject 9-12 Units into the skin at bedtime.   levofloxacin 500 MG tablet Commonly known as:  LEVAQUIN Take 1 tablet (500 mg total) by mouth every other day. On Monday and Wednesday after dialysis.   omeprazole 20 MG capsule Commonly known as:  PRILOSEC Take 20 mg by mouth every evening.   sevelamer carbonate 800 MG tablet Commonly known as:  RENVELA Take 800-1,600 mg by mouth 3 (three) times daily with meals. Takes 2 tabs in am, 1 tab midday, 2 tabs in evening   simvastatin 40 MG tablet Commonly known as:  ZOCOR Take 20 mg by mouth daily at 6 PM.        Disposition and follow-up:   Mr.Chad Murray was [facility] in Stable [facility].  At the hospital follow up visit please address:  1.  Pneumonia.  Ensure resolution of fevers, cough, dyspnea.  2.  Lymphadenopathy.  Recurrence of constitutional symptoms or clinical lymphadenopathy that led to [facility] admission?  2.  Labs / imaging needed at time of follow-up: none  3.  Pending labs/ test needing follow-up:  Blood cultures (12/11/2015); EBV PCR (12/13/2015)  Follow-up Appointments: Follow-up Information    HOFFMAN,BYRON, MD. Schedule an appointment as soon as possible for a visit today.   Specialty:  Internal Medicine Why:  for hospital follow up in 1 to 2 weeks.  Contact information: [address] [place] [address] [phone]           Hospital Course by problem list: Active Problems:   HCAP (healthcare-associated pneumonia)   1. Pneumonia Presented with fever, weakness, cough, dyspnea, and airspace opacities on CXR suggestive of pneumonia.  He was initially given broad spectrum vancomycin and piperacillin-tazobactam in the ED, with antibiotic therapy de-escalated with substitution of cefepime for piperacillin-tazobactam at admission, then discontinuation of vancomycin the day after admission. He did not have signs of shock or critical illness at admission, and he quickly defervesce with improvement in cough and dyspnea. Blood cultures with no growth at 3 days.  Discharged on levofloxacin  500 mg every other day (Mon and Wed after dialysis) to complete 7 days of treatment for community acquired pneumonia.    2. Recent EBV Viremia and Generalized Lymphadenopathy With his recent prolonged hospitalization at [facility] and concern for lymphoma vs EBV viremia, consideration was given to the possibility that his new fevers could be due to the same process.  He did not have any clinical lymphadenopathy during his admission, and constitutional  symptoms improved with antibiotics.  EBV titer by PCR pending at discharge to investigate whether recurrent EBV viremia could be related to his febrile illness.  3. ESRD Received HD on Thurs and Sat as inpatient.  Will resume home MWF schedule.  Discharge Vitals:   BP (!) 141/102   Pulse 75   Temp 98.1 F (36.7 C)   Resp 18   Ht 5\' 9"  (1.753 m)   Wt 186 lb 8.2 oz (84.6 kg)   SpO2 100%   BMI 27.54 kg/m   Physical Exam General: Vital signs reviewed.  Patient is chronically ill appearing, in no acute distress and cooperative with exam.  Cardiovascular: RRR Pulmonary/Chest: Mild coarse crackles. Extremities: R AVF in use with HD. Neurological: Alert and awake. Responds to all questions appropriately.  Pertinent Labs, Studies, and Procedures:   Chest Radiographs PA and Lateral (12/11/15):  "Patchy alveolar opacities likely reflect bilateral pneumonia given the patient's symptoms. There may be a component of low-grade CHF."  Blood Cultures x2 (12/11/15): no growth 3 days  C Diff (12/12/15): negative  Discharge Instructions: Discharge Instructions    Diet - low sodium heart healthy    Complete by:  As directed   Increase activity slowly    Complete by:  As directed     [name],  Please take Levaquin antibiotic on Monday after dialysis and Wednesday after dialysis. Please be sure to follow up with Dr. Mikey Bussing in 1-2 weeks for hospital follow up.    Thank you for allowing [place] to be involved in your healthcare while you were hospitalized at [facility].   Please note that there have been changes to your home medications.  --> PLEASE LOOK AT YOUR DISCHARGE MEDICATION LIST FOR DETAILS.  Please call your PCP if you have any questions or concerns, or any difficulty getting any of your medications.  Please return to the ER if you have worsening of your symptoms or new severe symptoms arise.   Signed: Alm Bustard, MD 12/15/2015, 8:35 AM   Pager:  [phone]

## 2015-12-12 NOTE — Progress Notes (Signed)
VASCULAR LAB PRELIMINARY  PRELIMINARY  PRELIMINARY  PRELIMINARY  Bilateral lower extremity venous duplex completed.    Preliminary report:  There is no obvious evidence of DVT or SVT noted in the visualized veins of the bilateral lower extremities.   Eamonn Sermeno, RVT 12/12/2015, 2:14 PM

## 2015-12-12 NOTE — Progress Notes (Signed)
Inpatient Diabetes Program Recommendations  AACE/ADA: New Consensus Statement on Inpatient Glycemic Control (2015)  Target Ranges:  Prepandial:   less than 140 mg/dL      Peak postprandial:   less than 180 mg/dL (1-2 hours)      Critically ill patients:  140 - 180 mg/dL   Results for Chad NorrisDMISTEN, Yeray (MRN [id number]) as of 12/12/2015 13:25  Ref. Range 12/11/2015 23:09 12/12/2015 09:00 12/12/2015 12:44  Glucose-Capillary Latest Ref Range: 65 - 99 mg/dL 132224 (H) 440198 (H) 102217 (H)    Review of Glycemic Control  Diabetes history: DM2 Outpatient Diabetes medications: Lantus 9-12 units HS, Novolog 4-7 TID Current orders for Inpatient glycemic control: Novolog 0-9 units TID with meals, Novolog 0-5 units HS Inpatient Diabetes Program Recommendations: Correction (SSI): Please consider increasing to moderate correction scale.  Thanks, Kristine LineaKaren Verenice Westrich, RN, BSN Diabetes Coordinator Inpatient Diabetes Program [phone] (Team Pager) [phone] ([facility] office) [phone] [facility] office) [phone] [facility] office)

## 2015-12-12 NOTE — Consult Note (Signed)
[facility] Renal Consultation Note    Indication for Consultation:  Management of ESRD/hemodialysis; anemia, hypertension/volume and secondary hyperparathyroidism  HPI: Chad Murray is a 80 y.o. male.   Chad Murray has a PMH sig for CAD, DM, HTN, and ESRD on HD MWF who had been admitted at [facility] from 7/28-8/17/17 for sepsis and respiratory failure requiring intubation and treated with vanc/cefepime with improvement and discharged to home, however he developed malaise, fevers, cough, chills, and weakness 3 days prior to admission and presented to [facility] on 12/11/15 and was subsequently admitted for HCAP.  We were asked to help manage his ESRD/dialysis needs as well as his renal related medical conditions.  His main complaint today is diarrhea and incontinence which started today.  Past Medical History:  Diagnosis Date  . Coronary artery disease   . Diabetes mellitus without complication (HCC)   . Hypertension   . Renal disorder    Past Surgical History:  Procedure Laterality Date  . APPENDECTOMY    . BACK SURGERY     Family History:   History reviewed. No pertinent family history. Social History:  reports that he has quit smoking. He does not have any smokeless tobacco history on file. He reports that he does not drink alcohol or use drugs. Allergies  Allergen Reactions  . Enalapril Cough  . Lisinopril Cough   Prior to Admission medications   Medication Sig Start Date End Date Taking? Authorizing Provider  amLODipine (NORVASC) 10 MG tablet Take 10 mg by mouth daily.   Yes Historical Provider, MD  aspirin EC 81 MG tablet Take 81 mg by mouth daily.    Yes Historical Provider, MD  b complex-vitamin c-folic acid (NEPHRO-VITE) 0.8 MG TABS tablet Take 1 tablet by mouth daily.   Yes Historical Provider, MD  carvedilol (COREG) 25 MG tablet Take 25 mg by mouth 2 (two) times daily with a meal.    Yes Historical Provider, MD  cinacalcet (SENSIPAR) 30 MG tablet Take 30 mg by  mouth daily with supper.   Yes Historical Provider, MD  clopidogrel (PLAVIX) 75 MG tablet Take 75 mg by mouth daily. 09/05/15  Yes Historical Provider, MD  gabapentin (NEURONTIN) 100 MG capsule Take 100 mg by mouth every Monday, Wednesday, and Friday with hemodialysis. Takes at dialysis only 09/05/15  Yes Historical Provider, MD  insulin aspart (NOVOLOG) 100 UNIT/ML injection Inject 4-7 Units into the skin 3 (three) times daily with meals. Patient uses sliding scale   Yes Historical Provider, MD  insulin glargine (LANTUS) 100 UNIT/ML injection Inject 9-12 Units into the skin at bedtime.    Yes Historical Provider, MD  omeprazole (PRILOSEC) 20 MG capsule Take 20 mg by mouth every evening.    Yes Historical Provider, MD  sevelamer carbonate (RENVELA) 800 MG tablet Take 800-1,600 mg by mouth 3 (three) times daily with meals. Takes 2 tabs in am, 1 tab midday, 2 tabs in evening   Yes Historical Provider, MD  simvastatin (ZOCOR) 40 MG tablet Take 20 mg by mouth daily at 6 PM.   Yes Historical Provider, MD  oxyCODONE-acetaminophen (PERCOCET) 5-325 MG tablet Take 1-2 tablets by mouth every 6 (six) hours as needed. Patient not taking: Reported on 12/11/2015 09/11/15   Geoffery Lyons, MD   Current Facility-Administered Medications  Medication Dose Route Frequency Provider Last Rate Last Dose  . acetaminophen (TYLENOL) tablet 650 mg  650 mg Oral Q6H PRN Reymundo Poll, MD   650 mg at 12/11/15 2245  . aspirin EC tablet  81 mg  81 mg Oral Daily Tasrif Ahmed, MD   81 mg at 12/11/15 1950  . carvedilol (COREG) tablet 25 mg  25 mg Oral BID WC Tasrif Ahmed, MD      . ceFEPIme (MAXIPIME) 2 g in dextrose 5 % 50 mL IVPB  2 g Intravenous Q M,W,F-1800 Alm BustardMatthew O'Sullivan, MD   2 g at 12/11/15 1951  . cinacalcet (SENSIPAR) tablet 30 mg  30 mg Oral Q supper Tasrif Ahmed, MD   30 mg at 12/11/15 2245  . clopidogrel (PLAVIX) tablet 75 mg  75 mg Oral Daily Tasrif Ahmed, MD   75 mg at 12/11/15 1950  . [START ON 12/13/2015] gabapentin  (NEURONTIN) capsule 100 mg  100 mg Oral Q M,W,F-HD Tasrif Ahmed, MD      . heparin injection 5,000 Units  5,000 Units Subcutaneous Q8H Tasrif Ahmed, MD   5,000 Units at 12/12/15 0620  . insulin aspart (novoLOG) injection 0-5 Units  0-5 Units Subcutaneous QHS Tasrif Ahmed, MD      . insulin aspart (novoLOG) injection 0-9 Units  0-9 Units Subcutaneous TID WC Tasrif Ahmed, MD      . multivitamin (RENA-VIT) tablet 1 tablet  1 tablet Oral QHS Tasrif Ahmed, MD   1 tablet at 12/11/15 2245  . pantoprazole (PROTONIX) EC tablet 40 mg  40 mg Oral Daily Tasrif Ahmed, MD   40 mg at 12/11/15 1950  . sevelamer carbonate (RENVELA) tablet 1,600 mg  1,600 mg Oral BID WC Tasrif Ahmed, MD      . sevelamer carbonate (RENVELA) tablet 800 mg  800 mg Oral Q lunch Tyson Aliasuncan Thomas Vincent, MD      . simvastatin (ZOCOR) tablet 20 mg  20 mg Oral q1800 Tasrif Ahmed, MD   20 mg at 12/11/15 1951  . [START ON 12/13/2015] vancomycin (VANCOCIN) IVPB 1000 mg/200 mL premix  1,000 mg Intravenous Q M,W,F-HD Faye Ramsayachel L Rumbarger, [facility]       Labs: Basic Metabolic Panel:  Recent Labs Lab 12/11/15 1500 12/12/15 0354  NA 138 137  K 4.7 4.7  CL 104 102  CO2 21* 22  GLUCOSE 52* 181*  BUN 60* 64*  CREATININE 6.73* 7.20*  CALCIUM 7.5* 7.5*   Liver Function Tests:  Recent Labs Lab 12/11/15 1500  AST 21  ALT 15*  ALKPHOS 59  BILITOT 0.4  PROT 5.2*  ALBUMIN 2.3*   No results for input(s): LIPASE, AMYLASE in the last 168 hours. No results for input(s): AMMONIA in the last 168 hours. CBC:  Recent Labs Lab 12/11/15 1345 12/11/15 1500  WBC 4.8 10.4  NEUTROABS 3.8 8.2*  HGB 3.6* 8.5*  HCT 11.6* 27.5*  MCV 100.0 100.7*  PLT QUESTIONABLE RESULTS, RECOMMEND RECOLLECT TO VERIFY 161   Cardiac Enzymes: No results for input(s): CKTOTAL, CKMB, CKMBINDEX, TROPONINI in the last 168 hours. CBG:  Recent Labs Lab 12/11/15 2309  GLUCAP 224*   Iron Studies: No results for input(s): IRON, TIBC, TRANSFERRIN, FERRITIN in the last 72  hours. Studies/Results: Dg Chest 2 View  Result Date: 12/11/2015 CLINICAL DATA:  Two days of cough and fever. History of diabetes, hypertension, unspecified renal disorder, former smoker. EXAM: CHEST  2 VIEW COMPARISON:  Portable chest x-ray of November 01, 2015. FINDINGS: There new confluent interstitial and alveolar opacities bilaterally. The heart is normal in size. The pulmonary vascularity is not clearly engorged. There is no pleural effusion. There is calcification in the wall of the aortic arch. The bony thorax exhibits no acute abnormality. IMPRESSION:  Patchy alveolar opacities likely reflect bilateral pneumonia given the patient's symptoms. There may be a component of low-grade CHF. Electronically Signed   By: David  [place] M.D.   On: 12/11/2015 14:43    ROS: Pertinent items are noted in HPI. Physical Exam: Vitals:   12/11/15 2051 12/11/15 2131 12/11/15 2232 12/12/15 0545  BP:  95/72  (!) 91/41  Pulse: 89 84  84  Resp:  18  18  Temp:  (!) 101.5 F (38.6 C) 99.8 F (37.7 C) 98.7 F (37.1 C)  TempSrc:  Oral Oral Oral  SpO2: 95% 96%  90%  Weight:    89.4 kg (197 lb 1.5 oz)  Height:          Weight change:   Intake/Output Summary (Last 24 hours) at 12/12/15 0843 Last data filed at 12/12/15 0539  Gross per 24 hour  Intake              290 ml  Output                0 ml  Net              290 ml   BP (!) 91/41 (BP Location: Left Leg)   Pulse 84   Temp 98.7 F (37.1 C) (Oral)   Resp 18   Ht 5\' 9"  (1.753 m)   Wt 89.4 kg (197 lb 1.5 oz)   SpO2 90%   BMI 29.11 kg/m  General appearance: alert, cooperative and no distress Head: Normocephalic, without obvious abnormality, atraumatic Resp: rales bibasilar and rhonchi bilaterally Cardio: no rub GI: soft, non-tender; bowel sounds normal; no masses,  no organomegaly Extremities: edema 2+ pitting of lower extremites and R forearm AVF +T/B Dialysis Access:  Dialysis Orders: Center: [facility]  on MWF .  K+ 2 meq/L  Ca++ 2.5  meq/L  Bicarb 35 meq/L  Na+ 137 meq/L  Na+ Modeling none  Dialyzer F180NR  Dialysate Temperature (C) 36  BFR-As tolerated to a maximum of: 450 mL/min  DFR 800 mL/min  Duration of treatment 3.75 Hr  Dry weight (kg) 85.5kg  Access Site AVF  Access Site Location right  Keep SBP >: 90    Assessment/Plan: 1.  Diarrhea- worrisome for C Diff given his recent tx for PNA.  W/u underway 2. PNA- per primary svc 3.  ESRD -  Off schedule, but will plan for HD today and again on Saturday and get back on schedule Monday. 4.  Hypertension/volume  - +2 edema of lower extremities, however low BP may impair ability to UF.   5.  Anemia  - will need to obtain outpatient orders  6.  Metabolic bone disease -  Follow ca/phos 7.  Nutrition - evidence of moderate to severe protein malnutrition.  Renal diet but may benefit from supplementation.   Irena Cords, MD [facility] Pager [phone] 12/12/2015, 8:43 AM

## 2015-12-12 NOTE — Progress Notes (Signed)
Dialysis treatment completed.  3000 mL ultrafiltrated and net fluid removal 2500 mL.    Patient status unchanged. Lung sounds diminished to ausculation in all fields. Generalized BLE edema. Cardiac: A fib.  Disconnected lines and removed needles.  Pressure held for 10 minutes and band aid/gauze dressing applied.  Report given to bedside RN, lauren.

## 2015-12-12 NOTE — Progress Notes (Signed)
   Subjective: He is feeling somewhat improved since coming to the hospital yesterday, with no subjective fevers or chills, and less shortness of breath.  Objective:  Vital signs in last 24 hours: Vitals:   12/11/15 2131 12/11/15 2232 12/12/15 0545 12/12/15 0900  BP: 95/72  (!) 91/41 133/87  Pulse: 84  84   Resp: 18  18   Temp: (!) 101.5 F (38.6 C) 99.8 F (37.7 C) 98.7 F (37.1 C)   TempSrc: Oral Oral Oral   SpO2: 96%  90% 96%  Weight:   197 lb 1.5 oz (89.4 kg)   Height:       Physical Exam  Constitutional: He is oriented to person, place, and time.  Somnolent but easily arousable to voice, in no distress  Cardiovascular: Normal rate and regular rhythm.   Pulmonary/Chest:  Bibasilar crackles, no respiratory distress  Abdominal: Soft. He exhibits no distension. There is no tenderness.  Musculoskeletal:  Bilateral LE grossly edematous  Neurological: He is alert and oriented to person, place, and time.  Psychiatric: He has a normal mood and affect. His behavior is normal.   CBC Latest Ref Rng & Units 12/11/2015 12/11/2015 09/11/2015  WBC 4.0 - 10.5 K/uL 10.4 4.8 7.7  Hemoglobin 13.0 - 17.0 g/dL 1.6(X8.5(L) 3.6(LL) 11.0(L)  Hematocrit 39.0 - 52.0 % 27.5(L) 11.6(L) 34.9(L)  Platelets 150 - 400 K/uL 161 QUESTIONABLE RESULTS, RECOMMEND RECOLLECT TO VERIFY PLATELET CLUMPS NOTED ON SMEAR, UNABLE TO ESTIMATE   BMP Latest Ref Rng & Units 12/12/2015 12/11/2015 09/11/2015  Glucose 65 - 99 mg/dL 096(E181(H) 45(W52(L) 098(J279(H)  BUN 6 - 20 mg/dL 19(J64(H) 47(W60(H) 29(F28(H)  Creatinine 0.61 - 1.24 mg/dL 6.21(H7.20(H) 0.86(V6.73(H) 7.84(O6.86(H)  Sodium 135 - 145 mmol/L 137 138 135  Potassium 3.5 - 5.1 mmol/L 4.7 4.7 5.4(H)  Chloride 101 - 111 mmol/L 102 104 100(L)  CO2 22 - 32 mmol/L 22 21(L) 24  Calcium 8.9 - 10.3 mg/dL 7.5(L) 7.5(L) 9.2   Blood Cult[facility] from [facility] drawn on Mon 9/4 negative at 48 hrs per verbal report  Assessment/Plan:  Active Problems:   HCAP (healthcare-associated  pneumonia)  #Fever #Pneumonia With fever, cough, dyspnea, and airspace opacities on CXR, concern for pneumonia.  DVT/PE could also cause fever and dyspnea.  No clinical lymphadenopathy, but with the [facility] is possible.  Will continue empiric treatment for PNA with Cefepime. -Follow WBC -C Diff -DVT US of bilateral legs -D/c vancomycin -Continue Cefepime -F/u BCx  #ESRD Dialysis today  #DM -SSI  #HTN -Continue home meds  Dispo: Anticipated discharge in approximately 2 day(s).   Chad BustardMatthew O'Sullivan, MD 12/12/2015, 11:33 AM Pager: [phone]

## 2015-12-13 LAB — BASIC METABOLIC PANEL
ANION GAP: 18 — AB (ref 5–15)
BUN: 33 mg/dL — ABNORMAL HIGH (ref 6–20)
CALCIUM: 7.6 mg/dL — AB (ref 8.9–10.3)
CO2: 20 mmol/L — AB (ref 22–32)
CREATININE: 4.92 mg/dL — AB (ref 0.61–1.24)
Chloride: 96 mmol/L — ABNORMAL LOW (ref 101–111)
GFR calc non Af Amer: 10 mL/min — ABNORMAL LOW (ref >60)
GFR, EST AFRICAN AMERICAN: 12 mL/min — AB (ref >60)
GLUCOSE: 376 mg/dL — AB (ref 65–99)
POTASSIUM: 4.2 mmol/L (ref 3.5–5.1)
SODIUM: 134 mmol/L — AB (ref 135–145)

## 2015-12-13 LAB — CBC WITH DIFFERENTIAL/PLATELET
BASOS ABS: 0 10*3/uL (ref 0.0–0.1)
BASOS PCT: 0 %
EOS ABS: 0.1 10*3/uL (ref 0.0–0.7)
EOS PCT: 1 %
HCT: 26 % — ABNORMAL LOW (ref 39.0–52.0)
Hemoglobin: 8.3 g/dL — ABNORMAL LOW (ref 13.0–17.0)
Lymphocytes Relative: 10 %
Lymphs Abs: 0.8 10*3/uL (ref 0.7–4.0)
MCH: 31.6 pg (ref 26.0–34.0)
MCHC: 31.9 g/dL (ref 30.0–36.0)
MCV: 98.9 fL (ref 78.0–100.0)
MONO ABS: 0.4 10*3/uL (ref 0.1–1.0)
Monocytes Relative: 5 %
Neutro Abs: 6.6 10*3/uL (ref 1.7–7.7)
Neutrophils Relative %: 84 %
PLATELETS: 180 10*3/uL (ref 150–400)
RBC: 2.63 MIL/uL — AB (ref 4.22–5.81)
RDW: 19.1 % — AB (ref 11.5–15.5)
WBC: 7.8 10*3/uL (ref 4.0–10.5)

## 2015-12-13 LAB — GLUCOSE, CAPILLARY
GLUCOSE-CAPILLARY: 290 mg/dL — AB (ref 65–99)
GLUCOSE-CAPILLARY: 247 mg/dL — AB (ref 65–99)
Glucose-Capillary: 353 mg/dL — ABNORMAL HIGH (ref 65–99)
Glucose-Capillary: 315 mg/dL — ABNORMAL HIGH (ref 65–99)

## 2015-12-13 NOTE — Care Management Note (Addendum)
Case Management Note  Patient Details  Name: Allen NorrisWilliam Stock MRN: [id number] Date of Birth: 21-Feb-1936  Subjective/Objective:                 Patient from home with wife. [facility]. LM with Matilde BashShannon Willard liaison [facility] [phone], for referral to resume services with possible DC over the weekend. [facility]   Action/Plan:  [facility] FAX referral w/ order to [phone]) [phone]  Expected Discharge Date:                  Expected Discharge Plan:  Home w Home Health Services  In-House Referral:     Discharge planning Services  CM Consult  Post Acute Care Choice:  Home Health Choice offered to:  Patient  DME Arranged:  N/A DME Agency:  NA  HH Arranged:  RN HH Agency:  [facility]  Status of Service:  In process, will continue to follow  If discussed at Long Length of Stay Meetings, dates discussed:    Additional Comments:  Lawerance SabalDebbie Tirth Cothron, RN 12/13/2015, 2:43 PM

## 2015-12-13 NOTE — Progress Notes (Signed)
Inpatient Diabetes Program Recommendations  AACE/ADA: New Consensus Statement on Inpatient Glycemic Control (2015)  Target Ranges:  Prepandial:   less than 140 mg/dL      Peak postprandial:   less than 180 mg/dL (1-2 hours)      Critically ill patients:  140 - 180 mg/dL   Lab Results  Component Value Date   GLUCAP 290 (H) 12/13/2015   Review of Glycemic Control  Diabetes history: DM 2 Outpatient Diabetes medications: Lantus 9-12 units Daily, Novolog 4-7 units TID Current orders for Inpatient glycemic control: Novolog Sensitive + HS scale  Inpatient Diabetes Program Recommendations:  Insulin - Basal: Glucose 200-300's this am. Patient takes Lantus at home. Please consider ordering low dose Lantus 6-8 units Daily, first dose today.  Thanks,  Christena DeemShannon Ameer Sanden RN, MSN, [facility] Inpatient Diabetes Coordinator Team Pager [phone] (8a-5p)

## 2015-12-13 NOTE — Progress Notes (Signed)
Patient ID: Chad Murray, male   DOB: 01-22-1936, 80 y.o.   MRN: [id number]  [facility] Progress Note    Subjective:   Feels well no new complaints   Objective:   BP 131/68 (BP Location: Left Arm)   Pulse 90   Temp 98.6 F (37 C) (Oral)   Resp 18   Ht 5\' 9"  (1.753 m)   Wt 88.6 kg (195 lb 5.2 oz)   SpO2 97%   BMI 28.84 kg/m   Intake/Output: I/O last 3 completed shifts: In: 1120 [P.O.:1070; IV Piggyback:50] Out: 2500 [Other:2500]   Intake/Output this shift:  Total I/O In: -  Out: 250 [Stool:250] Weight change: 1.894 kg (4 lb 2.8 oz)  Physical Exam: Gen:WD WM in NAD CVS:no rub Resp:cta WUJ:WJXBJY Ext:2+ lower ext edema, R cimino AVF +T/B  Labs: BMET  Recent Labs Lab 12/11/15 1500 12/12/15 0354 12/12/15 1525 12/13/15 0555  NA 138 137 136 134*  K 4.7 4.7 4.5 4.2  CL 104 102 103 96*  CO2 21* 22 20* 20*  GLUCOSE 52* 181* 168* 376*  BUN 60* 64* 69* 33*  CREATININE 6.73* 7.20* 7.47* 4.92*  ALBUMIN 2.3*  --  1.9*  --   CALCIUM 7.5* 7.5* 7.6* 7.6*  PHOS  --   --  5.4*  --    CBC  Recent Labs Lab 12/11/15 1345 12/11/15 1500 12/12/15 1525 12/13/15 0555  WBC 4.8 10.4 8.0 7.8  NEUTROABS 3.8 8.2*  --  6.6  HGB 3.6* 8.5* 8.2* 8.3*  HCT 11.6* 27.5* 25.4* 26.0*  MCV 100.0 100.7* 97.3 98.9  PLT QUESTIONABLE RESULTS, RECOMMEND RECOLLECT TO VERIFY 161 166 180    @IMGRELPRIORS @ Medications:    . aspirin EC  81 mg Oral Daily  . carvedilol  25 mg Oral BID WC  . ceFEPime (MAXIPIME) IV  2 g Intravenous Q M,W,F-1800  . cinacalcet  30 mg Oral Q supper  . clopidogrel  75 mg Oral Daily  . gabapentin  100 mg Oral Q M,W,F-HD  . heparin  5,000 Units Subcutaneous Q8H  . insulin aspart  0-5 Units Subcutaneous QHS  . insulin aspart  0-9 Units Subcutaneous TID WC  . multivitamin  1 tablet Oral QHS  . pantoprazole  40 mg Oral Daily  . sevelamer carbonate  1,600 mg Oral BID WC  . sevelamer carbonate  800 mg Oral Q lunch  . simvastatin  20 mg Oral  q1800   Dialysis Orders: Center: [facility]  on MWF .  K+ 2 meq/L  Ca++ 2.5 meq/L  Bicarb 35 meq/L  Na+ 137 meq/L  Na+ Modeling none  Dialyzer F180NR  Dialysate Temperature (C) 36  BFR-As tolerated to a maximum of: 450 mL/min  DFR 800 mL/min  Duration of treatment 3.75 Hr  Dry weight (kg) 85.5kg  Access Site AVF  Access Site Location right  Keep SBP >: 90    Assessment/Plan: 1. Diarrhea- negative for C Diff.  Likely due to ongoing abx tx for PNA.   2. PNA- per primary svc 3.  ESRD -  Off schedule, but will plan for HD on Saturday and get back on schedule Monday. 4.  Hypertension/volume  - +2 edema of lower extremities, however low BP may impair ability to UF.   5.  Anemia  - will need to obtain outpatient orders  6.  Metabolic bone disease -  Follow ca/phos 7.  Nutrition - evidence of moderate to severe protein malnutrition.  Renal diet but may benefit from  supplementation. 8. Disposition- hopeful discharge tomorrow after HD.   Irena CordsJoseph A. Ivoree Felmlee, MD [facility], [facility] Pager [phone] 12/13/2015, 1:08 PM

## 2015-12-13 NOTE — Care Management Important Message (Signed)
Important Message  Patient Details  Name: Chad NorrisWilliam Murray MRN: [id number] Date of Birth: [birth year]   Medicare Important Message Given:  Yes    Kyla BalzarineShealy, Akiel Fennell Abena 12/13/2015, 11:36 AM

## 2015-12-13 NOTE — Progress Notes (Signed)
.     Subjective: Symptomatically improved, with no subjective fevers or chills and less shortness of breath.  Says he was been wearing supplemental O2 only intermittently.  Objective:  Vital signs in last 24 hours: Vitals:   12/12/15 1827 12/12/15 2233 12/13/15 0425 12/13/15 0842  BP: 117/73 (!) 138/33 (!) 114/49 131/68  Pulse: 82 82 77 90  Resp:  17 20 18   Temp:  99.9 F (37.7 C) 98.6 F (37 C)   TempSrc:  Oral Oral   SpO2:  100% 97%   Weight:   195 lb 5.2 oz (88.6 kg)   Height:       Physical Exam  Constitutional: He is oriented to person, place, and time.  Alert, hungrily eating breakfast, in no distress  Cardiovascular: Normal rate and regular rhythm.   Pulmonary/Chest:  Mildly reduced breath sounds in right lower lung field, rare crackles  Abdominal: Soft. He exhibits no distension. There is no tenderness.  Musculoskeletal:  Bilateral LE grossly edematous  Neurological: He is alert and oriented to person, place, and time.  Psychiatric: He has a normal mood and affect. His behavior is normal.   CBC Latest Ref Rng & Units 12/13/2015 12/12/2015 12/11/2015  WBC 4.0 - 10.5 K/uL 7.8 8.0 10.4  Hemoglobin 13.0 - 17.0 g/dL 8.3(L) 8.2(L) 8.5(L)  Hematocrit 39.0 - 52.0 % 26.0(L) 25.4(L) 27.5(L)  Platelets 150 - 400 K/uL 180 166 161   BMP Latest Ref Rng & Units 12/13/2015 12/12/2015 12/12/2015  Glucose 65 - 99 mg/dL 161(W376(H) 960(A168(H) 540(J181(H)  BUN 6 - 20 mg/dL 81(X33(H) 91(Y69(H) 78(G64(H)  Creatinine 0.61 - 1.24 mg/dL 9.56(O4.92(H) 1.30(Q7.47(H) 6.57(Q7.20(H)  Sodium 135 - 145 mmol/L 134(L) 136 137  Potassium 3.5 - 5.1 mmol/L 4.2 4.5 4.7  Chloride 101 - 111 mmol/L 96(L) 103 102  CO2 22 - 32 mmol/L 20(L) 20(L) 22  Calcium 8.9 - 10.3 mg/dL 7.6(L) 7.6(L) 7.5(L)   BCx no growth at 48 hours CDiff negative  US negative for DVT  Assessment/Plan:  Active Problems:   HCAP (healthcare-associated pneumonia)  #Fever #Pneumonia With fever, cough, dyspnea, and airspace opacities on CXR, concern for pneumonia.  Has been  afebrile and symptomatically improved with antibiotics here. No clinical lymphadenopathy, but with the [facility] is possible.  Will continue empiric treatment for PNA with Cefepime today, plan to change to oral antibiotic. -Follow WBC -EBV titer -Continue Cefepime today, plan to switch to oral tomorrow -F/u BCx  #ESRD MWF home schedule.  Dialyzed M, Thu this week. -Dialysis tomorrow morning  #DM -SSI  #HTN -Continue home meds  Dispo: Anticipated discharge in approximately 1 day(s).   Alm BustardMatthew O'Sullivan, MD 12/13/2015, 11:57 AM Pager: [phone]

## 2015-12-14 LAB — RENAL FUNCTION PANEL
Albumin: 1.9 g/dL — ABNORMAL LOW (ref 3.5–5.0)
Anion gap: 16 — ABNORMAL HIGH (ref 5–15)
BUN: 45 mg/dL — AB (ref 6–20)
CALCIUM: 8.2 mg/dL — AB (ref 8.9–10.3)
CHLORIDE: 95 mmol/L — AB (ref 101–111)
CO2: 20 mmol/L — AB (ref 22–32)
CREATININE: 5.81 mg/dL — AB (ref 0.61–1.24)
GFR, EST AFRICAN AMERICAN: 10 mL/min — AB (ref >60)
GFR, EST NON AFRICAN AMERICAN: 8 mL/min — AB (ref >60)
Glucose, Bld: 387 mg/dL — ABNORMAL HIGH (ref 65–99)
Phosphorus: 3.8 mg/dL (ref 2.5–4.6)
Potassium: 4.3 mmol/L (ref 3.5–5.1)
SODIUM: 131 mmol/L — AB (ref 135–145)

## 2015-12-14 LAB — CBC WITH DIFFERENTIAL/PLATELET
BASOS ABS: 0 10*3/uL (ref 0.0–0.1)
Basophils Relative: 0 %
Eosinophils Absolute: 0.2 10*3/uL (ref 0.0–0.7)
Eosinophils Relative: 2 %
HCT: 27.5 % — ABNORMAL LOW (ref 39.0–52.0)
HEMOGLOBIN: 8.8 g/dL — AB (ref 13.0–17.0)
LYMPHS ABS: 1.1 10*3/uL (ref 0.7–4.0)
LYMPHS PCT: 14 %
MCH: 31.5 pg (ref 26.0–34.0)
MCHC: 32 g/dL (ref 30.0–36.0)
MCV: 98.6 fL (ref 78.0–100.0)
Monocytes Absolute: 0.5 10*3/uL (ref 0.1–1.0)
Monocytes Relative: 7 %
NEUTROS PCT: 77 %
Neutro Abs: 6.5 10*3/uL (ref 1.7–7.7)
Platelets: 219 10*3/uL (ref 150–400)
RBC: 2.79 MIL/uL — AB (ref 4.22–5.81)
RDW: 18.7 % — ABNORMAL HIGH (ref 11.5–15.5)
WBC: 8.3 10*3/uL (ref 4.0–10.5)

## 2015-12-14 MED ORDER — LEVOFLOXACIN 500 MG PO TABS
500.0000 mg | ORAL_TABLET | ORAL | Status: DC
  Administered 2015-12-14: 500 mg via ORAL
  Filled 2015-12-14: qty 1

## 2015-12-14 MED ORDER — DARBEPOETIN ALFA 100 MCG/0.5ML IJ SOSY
PREFILLED_SYRINGE | INTRAMUSCULAR | Status: AC
  Filled 2015-12-14: qty 0.5

## 2015-12-14 MED ORDER — DARBEPOETIN ALFA 100 MCG/0.5ML IJ SOSY
100.0000 ug | PREFILLED_SYRINGE | Freq: Once | INTRAMUSCULAR | Status: AC
  Administered 2015-12-14: 100 ug via INTRAVENOUS
  Filled 2015-12-14: qty 0.5

## 2015-12-14 MED ORDER — LEVOFLOXACIN 500 MG PO TABS: 500.0000 mg | ORAL_TABLET | ORAL | 0 refills | Status: AC

## 2015-12-14 MED ORDER — HEPARIN SODIUM (PORCINE) 1000 UNIT/ML DIALYSIS
20.0000 [IU]/kg | INTRAMUSCULAR | Status: DC | PRN
  Filled 2015-12-14: qty 2

## 2015-12-14 NOTE — Procedures (Signed)
Patient was seen on dialysis and the procedure was supervised.  BFR 400  Via AVF BP is  145/73.   Patient appears to be tolerating treatment well  Chad Murray A 12/14/2015

## 2015-12-14 NOTE — Discharge Instructions (Signed)
[name],  Please take Levaquin antibiotic on Monday after dialysis and Wednesday after dialysis. Please be sure to follow up with Dr. Mikey BussingHoffman in 1-2 weeks for hospital follow up.    Thank you for allowing us to be involved in your healthcare [facility].   Please note that there have been changes to your home medications.  --> PLEASE LOOK AT YOUR DISCHARGE MEDICATION LIST FOR DETAILS.  Please call your PCP if you have any questions or concerns, or any difficulty getting any of your medications.  Please return to the ER if you have worsening of your symptoms or new severe symptoms arise.

## 2015-12-14 NOTE — Progress Notes (Signed)
   Subjective: Patient was seen and examined in dialysis this morning. He states his shortness of breath has improved. He denies cough, fever or chills. He is ready to go home.   Objective: Vital signs in last 24 hours: Vitals:   12/14/15 1000 12/14/15 1030 12/14/15 1100 12/14/15 1109  BP: (!) 128/109 92/73 121/85 (!) 141/102  Pulse: 70 70 80 75  Resp:      Temp:    98.1 F (36.7 C)  TempSrc:      SpO2:      Weight:    186 lb 8.2 oz (84.6 kg)  Height:       Physical Exam General: Vital signs reviewed.  Patient is chronically ill appearing, in no acute distress and cooperative with exam.  Cardiovascular: RRR Pulmonary/Chest: Mild coarse crackles. Extremities: R AVF in use with HD. Neurological: Alert and awake. Responds to all questions appropriately.  Assessment/Plan: Active Problems:   HCAP (healthcare-associated pneumonia)  Pneumonia: Patient presented with fever, cough, dyspnea, and airspace opacities on CXR, concerning for pneumonia. Patient has remained afebrile since admission on 9/6 and has symptomatically improved with antibiotics. Patient was transitioned off of IV antibiotics today to Levaquin given ESRD. He will receive 3 more doses (after HD) on Sat, Monday and Wednesday to complete a 7 day course.  -Discharge to home -Continue Levaquin 750 mg Q48H for 3 more doses following HD  Recent EBV Viremia: [facility] to 8/17 for high fevers and septic shock. There was no evidence of a bacterial infection, but did show EBV viremia as in addition to diffuse bulky lymphadenopathy in his chest, abdomen, and pelvis. Patient was evaluated for lymphoma. Needle core biopsy was an inadequate sample to process. He did not have an excisional node biopsy. A few weeks later, a repeat CT scan showed much improved adenopathy throughout his body. His EBV titers were also down trending at the time. As a result, further aggressive workup for  lymphoma was not pursued. On our exam, lymphadenopathy was not appreciated. Patient will follow up with his primary care physician. Repeat EBV viral load was also pending at time of discharge. -Follow up with primary care physician -Follow up on EBV titers  ESRD: Patient is on MWF home schedule. Patient received HD this morning and will resume his normal HD regimen on discharge.  HTN: Normotensive on home medications.   Dispo: Anticipated discharge today.   LOS: 3 days   Karlene LinemanAlexa Niyati Heinke, DO PGY-3 Internal Medicine Resident Pager # [phone] 12/14/2015 11:43 AM

## 2015-12-14 NOTE — Progress Notes (Signed)
Patient ID: Chad NorrisWilliam Murray, male   DOB: 19-Aug-1935, 80 y.o.   MRN: [id number]  [facility] Progress Note    Subjective:   Feels well no new complaints   Objective:   BP (!) 145/73   Pulse 72   Temp 98.4 F (36.9 C) (Oral)   Resp 18   Ht 5\' 9"  (1.753 m)   Wt 85.3 kg (188 lb 0.8 oz)   SpO2 100%   BMI 27.77 kg/m   Intake/Output: I/O last 3 completed shifts: In: 1420 [P.O.:1420] Out: 250 [Stool:250]   Intake/Output this shift:  No intake/output data recorded. Weight change: -1.395 kg (-3 lb 1.2 oz)  Physical Exam: Gen:WD WM in NAD CVS:no rub Resp:cta WUJ:WJXBJYAbd:benign Ext:2+ lower ext edema, R cimino AVF +T/B  Labs: BMET  Recent Labs Lab 12/11/15 1500 12/12/15 0354 12/12/15 1525 12/13/15 0555 12/14/15 0757  NA 138 137 136 134* 131*  K 4.7 4.7 4.5 4.2 4.3  CL 104 102 103 96* 95*  CO2 21* 22 20* 20* 20*  GLUCOSE 52* 181* 168* 376* 387*  BUN 60* 64* 69* 33* 45*  CREATININE 6.73* 7.20* 7.47* 4.92* 5.81*  ALBUMIN 2.3*  --  1.9*  --  1.9*  CALCIUM 7.5* 7.5* 7.6* 7.6* 8.2*  PHOS  --   --  5.4*  --  3.8   CBC  Recent Labs Lab 12/11/15 1345 12/11/15 1500 12/12/15 1525 12/13/15 0555 12/14/15 0549  WBC 4.8 10.4 8.0 7.8 8.3  NEUTROABS 3.8 8.2*  --  6.6 6.5  HGB 3.6* 8.5* 8.2* 8.3* 8.8*  HCT 11.6* 27.5* 25.4* 26.0* 27.5*  MCV 100.0 100.7* 97.3 98.9 98.6  PLT QUESTIONABLE RESULTS, RECOMMEND RECOLLECT TO VERIFY 161 166 180 219    @IMGRELPRIORS @ Medications:    . aspirin EC  81 mg Oral Daily  . carvedilol  25 mg Oral BID WC  . cinacalcet  30 mg Oral Q supper  . clopidogrel  75 mg Oral Daily  . gabapentin  100 mg Oral Q M,W,F-HD  . heparin  5,000 Units Subcutaneous Q8H  . insulin aspart  0-5 Units Subcutaneous QHS  . insulin aspart  0-9 Units Subcutaneous TID WC  . levofloxacin  500 mg Oral Q48H  . multivitamin  1 tablet Oral QHS  . pantoprazole  40 mg Oral Daily  . sevelamer carbonate  1,600 mg Oral BID WC  . sevelamer carbonate  800 mg  Oral Q lunch  . simvastatin  20 mg Oral q1800   Dialysis Orders: [facility] .  K+ 2 meq/L  Ca++ 2.5 meq/L  Bicarb 35 meq/L  Na+ 137 meq/L  Na+ Modeling none  Dialyzer [id number]  Dialysate Temperature (C) 36  BFR-As tolerated to a maximum of: 450 mL/min  DFR 800 mL/min  Duration of treatment 3.75 Hr  Dry weight (kg) 85.5kg  Access Site AVF  Access Site Location right  Keep SBP >: 90    Assessment/Plan: 1. Diarrhea- negative for C Diff.  Likely due to ongoing abx tx for PNA.   2. PNA- per primary svc 3.  ESRD -  Off schedule, but will plan for HD on Saturday and get back on schedule Monday. Would be OK for discharge from a renal standpoint 4.  Hypertension/volume  - +2 edema of lower extremities, however low BP may impair ability to UF. Pt says this is usual and is trying to get it off a little at a time as OP  5.  Anemia  -  will need to obtain outpatient orders - hgb stable in the 8's- will give darbe today  6.  Metabolic bone disease -  Follow ca/phos 7.  Nutrition - evidence of moderate to severe protein malnutrition.  Renal diet but may benefit from supplementation. 8. Disposition- hopeful discharge today after HD.   Cecille Aver  [employer], [facility] Pager [phone] 12/14/2015, 10:15 AM

## 2015-12-16 LAB — CULTURE, BLOOD (ROUTINE X 2)
CULTURE: NO GROWTH
CULTURE: NO GROWTH

## 2015-12-16 LAB — EPSTEIN BARR VRS(EBV DNA BY PCR)
EBV DNA QN by PCR: 100 copies/mL
LOG10 EBV DNA QN PCR: 2 {Log_copies}/mL

## 2017-02-04 DEATH — deceased

## 2017-05-09 IMAGING — CT CT ABD-PELV W/O CM
2 of 4 series · 10 of 46 positions shown, 11 images · non-contrast
Comparison: None.

CLINICAL DATA: Epigastric abdominal pain and nausea for 1 month.
Chronic renal failure on dialysis.

EXAM:
CT ABDOMEN AND PELVIS WITHOUT CONTRAST
TECHNIQUE: Multidetector CT imaging of the abdomen and pelvis was performed
following the standard protocol without IV contrast.

[Series 201: routine, idose (2) · axial · 0.81mm/px · z∈[+26,+416]mm · 7 of 96 slices shown, 8 images]
[im 9/96  soft-tissue]
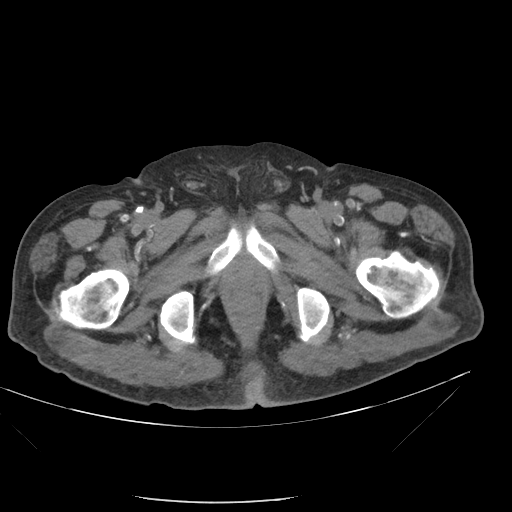
[im 9/96  bone]
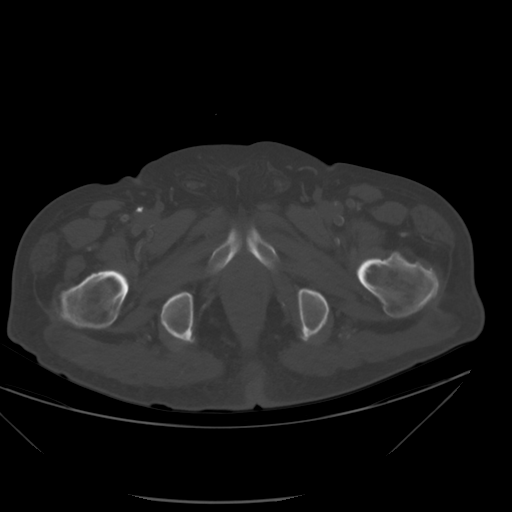
[im 21/96  soft-tissue]
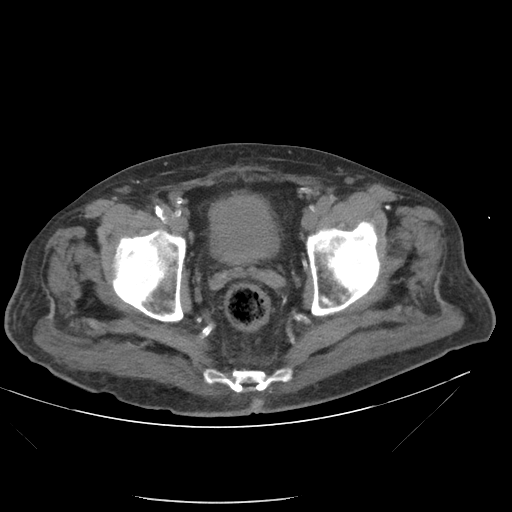
[im 34/96  soft-tissue]
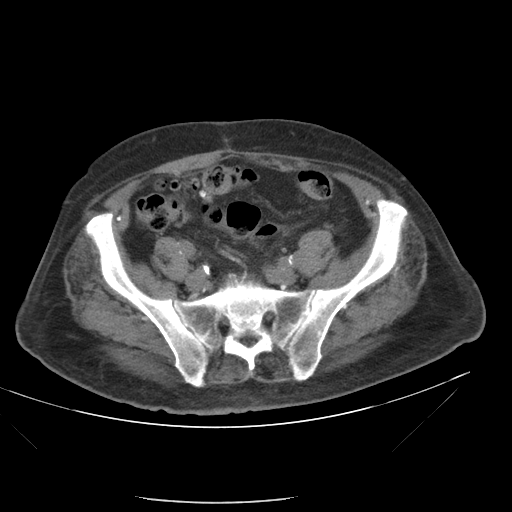
[im 50/96  soft-tissue]
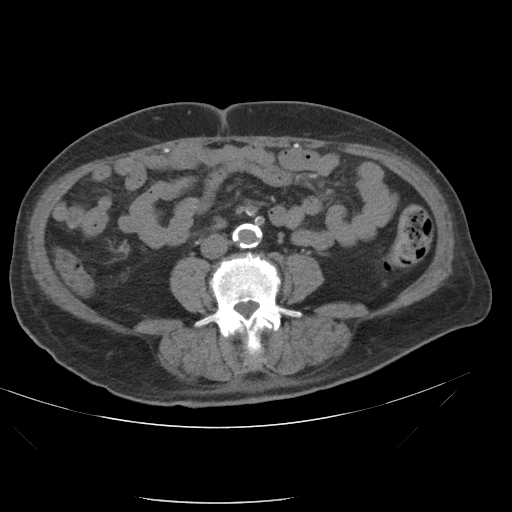
[im 62/96  soft-tissue]
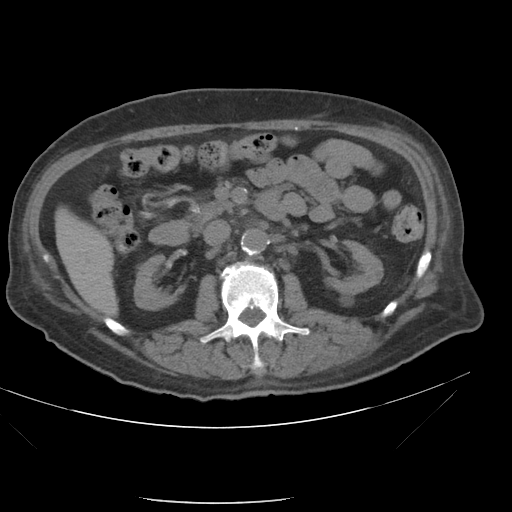
[im 75/96  soft-tissue]
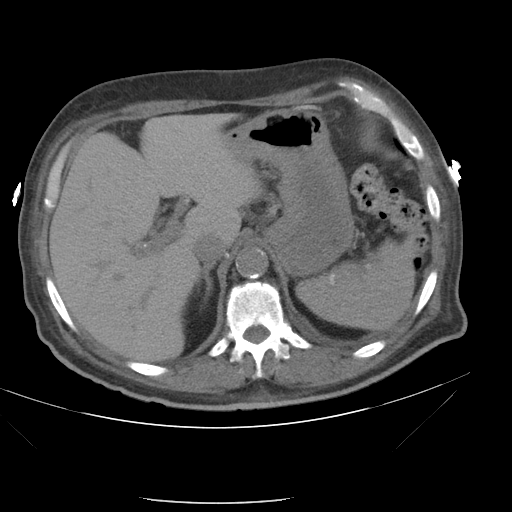
[im 87/96  soft-tissue]
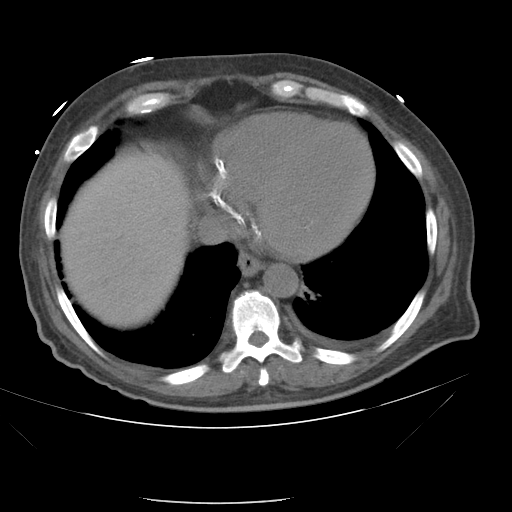

[Series 203: coronals, idose (2) · coronal · 0.45mm/px · 3 of 123 slices shown]
[im 41/123  soft-tissue]
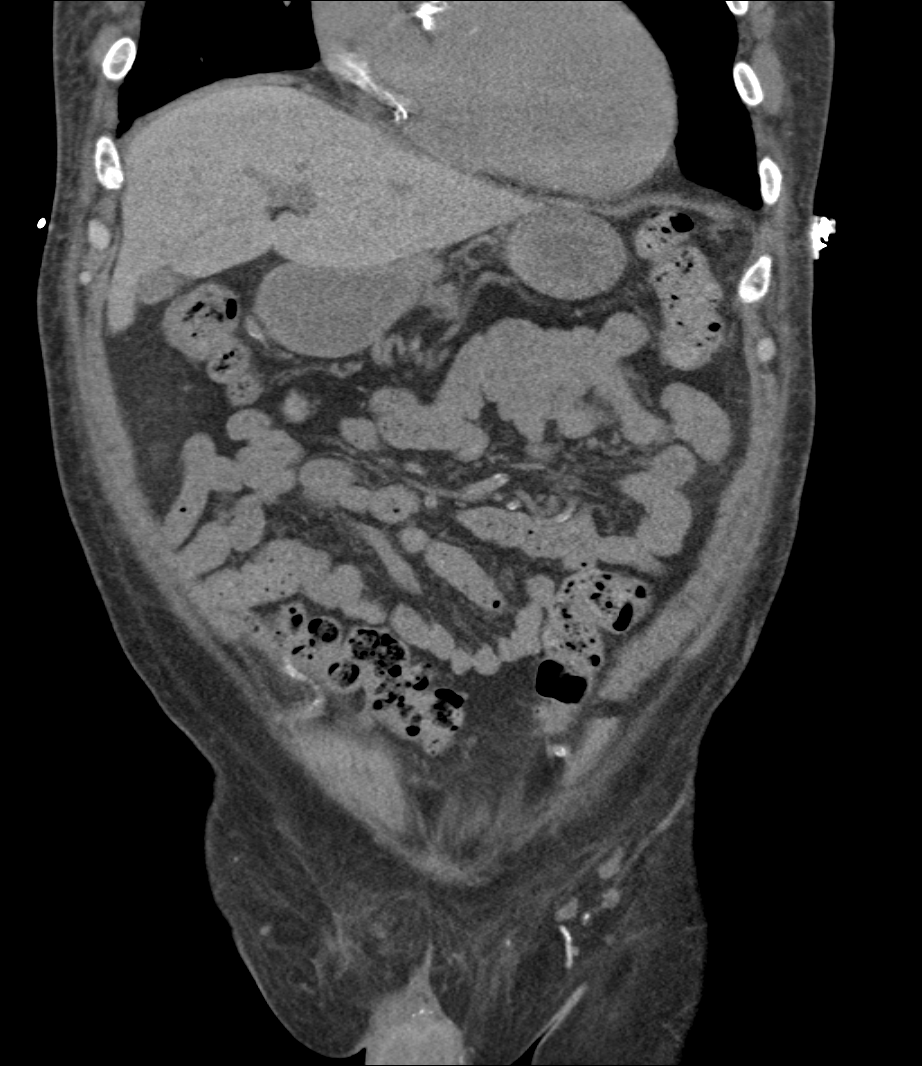
[im 55/123  soft-tissue]
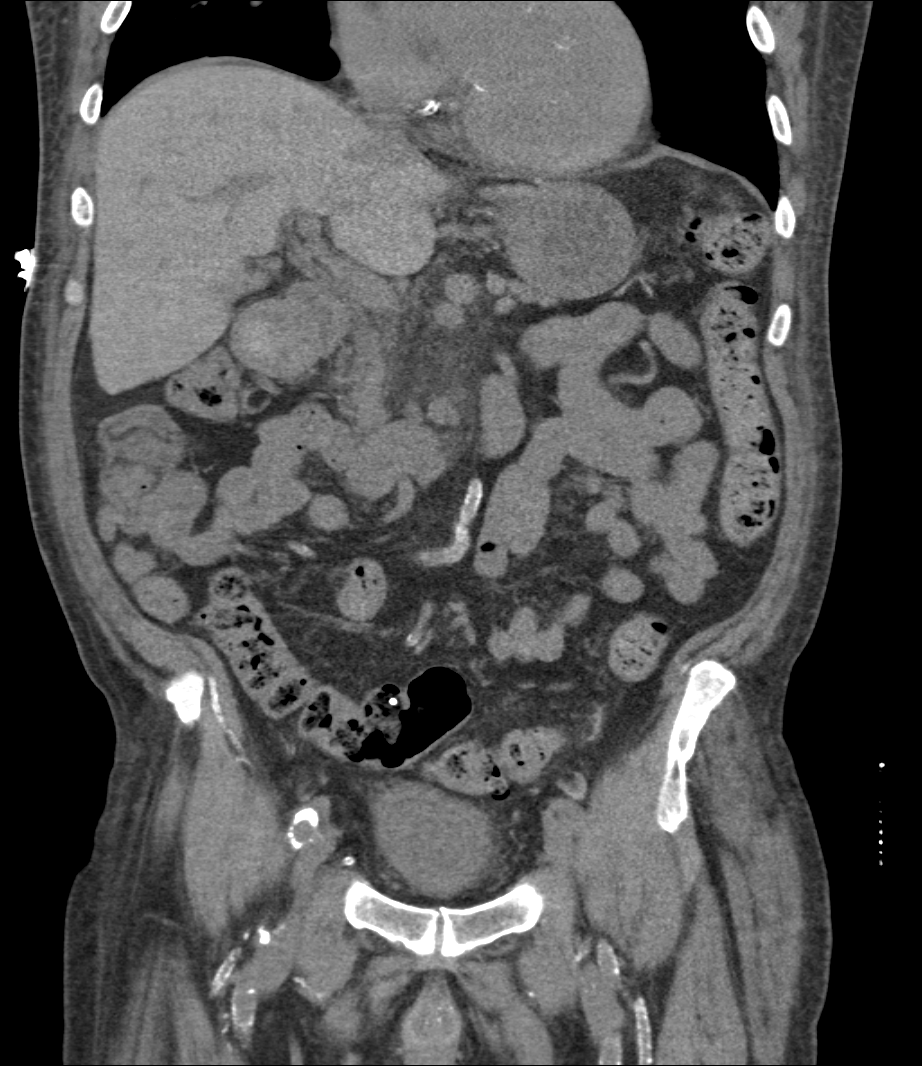
[im 68/123  soft-tissue]
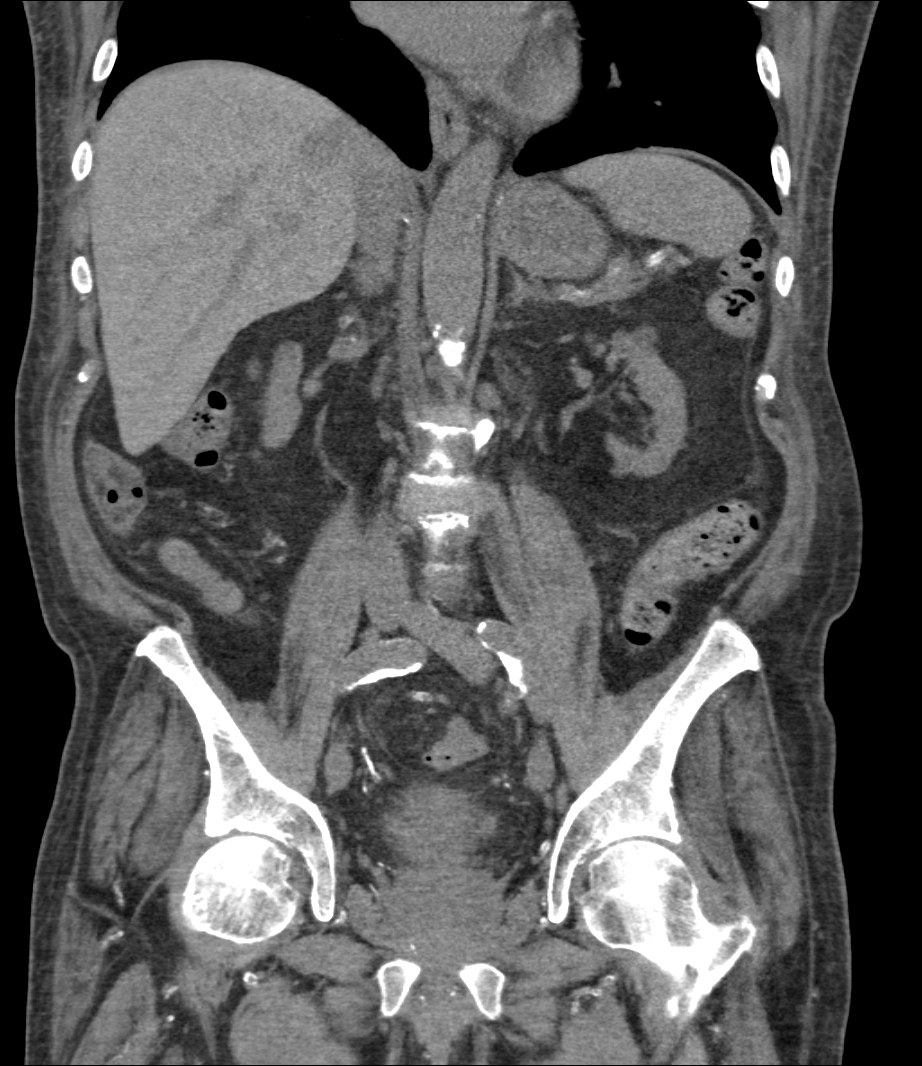

[10 of 46 positions shown; findings below may reference images not displayed]

FINDINGS: Lower chest: Tiny left pleural effusion versus pleural thickening
noted. Cardiomegaly noted as well as coronary artery calcification.

Hepatobiliary: No mass visualized on this un-enhanced exam.
Gallbladder is unremarkable.

Pancreas: No mass or inflammatory process identified on this
un-enhanced exam.

Spleen: Within normal limits in size.

Adrenals/Urinary Tract: Normal appearance of adrenal glands.
Bilateral renal artery calcification noted. Tiny punctate
calcifications in both kidneys may be due to vascular calcification
although a tiny intrarenal calculi cannot be excluded. No evidence
of ureteral calculi or hydronephrosis.

Bilateral renal parenchymal atrophy is noted as well as multiple
small low-attenuation lesions most likely representing cysts.
Urinary bladder is nondilated but shows mild diffuse wall thickening
and haziness in adjacent fat. This may be due to cystitis or
muscular hypertrophy from chronic bladder outlet obstruction.

Stomach/Bowel: No evidence of obstruction, inflammatory process, or
abnormal fluid collections. Diverticulosis seen mainly involving the
sigmoid colon, however there is no evidence of diverticulitis.

Vascular/Lymphatic: No pathologically enlarged lymph nodes. No
evidence of abdominal aortic aneurysm. Diffuse atherosclerotic
calcification involving abdominal aorta and diffuse vascular
calcification also seen elsewhere within the abdomen and pelvis.

Reproductive: Mildly enlarged prostate. Unremarkable appearance of
seminal vesicles.

Other: None.

Musculoskeletal:  No suspicious bone lesions identified.
IMPRESSION: Bilateral renal parenchymal atrophy and probable small renal cysts.
Tiny bilateral intrarenal calculi versus vascular calcification. No
evidence of ureteral calculi or hydronephrosis.

Mildly enlarged prostate. Mild diffuse bladder wall thickening with
stranding in the adjacent fat, which could be due to cystitis or
muscular hypertrophy secondary to chronic bladder outlet
obstruction. Consider urology consultation and cystoscopy for
further evaluation.

Colonic diverticulosis. No radiographic evidence of diverticulitis.

Tiny left pleural effusion versus pleural thickening.
